# Patient Record
Sex: Female | Born: 1955 | Race: White | Hispanic: No | Marital: Married
Health system: Southern US, Community
[De-identification: ages and names within clinical notes are randomized; demographics above are authoritative.]

---

## 2009-10-23 DIAGNOSIS — I10 Essential (primary) hypertension: Secondary | ICD-10-CM | POA: Insufficient documentation

## 2012-08-19 DIAGNOSIS — Z720 Tobacco use: Secondary | ICD-10-CM | POA: Insufficient documentation

## 2012-09-21 DIAGNOSIS — H40059 Ocular hypertension, unspecified eye: Secondary | ICD-10-CM | POA: Insufficient documentation

## 2012-09-21 DIAGNOSIS — K589 Irritable bowel syndrome without diarrhea: Secondary | ICD-10-CM | POA: Insufficient documentation

## 2012-09-21 DIAGNOSIS — M1710 Unilateral primary osteoarthritis, unspecified knee: Secondary | ICD-10-CM | POA: Insufficient documentation

## 2017-05-13 DIAGNOSIS — F1010 Alcohol abuse, uncomplicated: Secondary | ICD-10-CM | POA: Insufficient documentation

## 2018-02-23 DIAGNOSIS — Z8711 Personal history of peptic ulcer disease: Secondary | ICD-10-CM | POA: Insufficient documentation

## 2018-02-23 DIAGNOSIS — Z8719 Personal history of other diseases of the digestive system: Secondary | ICD-10-CM | POA: Insufficient documentation

## 2018-02-23 DIAGNOSIS — I4891 Unspecified atrial fibrillation: Secondary | ICD-10-CM | POA: Insufficient documentation

## 2018-02-23 DIAGNOSIS — G43109 Migraine with aura, not intractable, without status migrainosus: Secondary | ICD-10-CM | POA: Insufficient documentation

## 2018-02-23 DIAGNOSIS — D500 Iron deficiency anemia secondary to blood loss (chronic): Secondary | ICD-10-CM | POA: Insufficient documentation

## 2018-02-23 DIAGNOSIS — Z791 Long term (current) use of non-steroidal anti-inflammatories (NSAID): Secondary | ICD-10-CM | POA: Insufficient documentation

## 2018-02-25 DIAGNOSIS — S82122A Displaced fracture of lateral condyle of left tibia, initial encounter for closed fracture: Secondary | ICD-10-CM | POA: Insufficient documentation

## 2018-03-28 ENCOUNTER — Emergency Department (HOSPITAL_COMMUNITY): Payer: Self-pay

## 2018-03-28 ENCOUNTER — Inpatient Hospital Stay (HOSPITAL_COMMUNITY)
Admission: EM | Admit: 2018-03-28 | Discharge: 2018-04-21 | DRG: 308 | Disposition: E | Payer: Self-pay | Attending: Pulmonary Disease | Admitting: Pulmonary Disease

## 2018-03-28 ENCOUNTER — Emergency Department (HOSPITAL_COMMUNITY)
Admission: EM | Admit: 2018-03-28 | Discharge: 2018-03-28 | Discharge status: Absent without leave | Payer: Self-pay | Attending: Emergency Medicine | Admitting: Emergency Medicine

## 2018-03-28 DIAGNOSIS — E669 Obesity, unspecified: Secondary | ICD-10-CM | POA: Diagnosis present

## 2018-03-28 DIAGNOSIS — D696 Thrombocytopenia, unspecified: Secondary | ICD-10-CM | POA: Diagnosis not present

## 2018-03-28 DIAGNOSIS — Z888 Allergy status to other drugs, medicaments and biological substances status: Secondary | ICD-10-CM

## 2018-03-28 DIAGNOSIS — N179 Acute kidney failure, unspecified: Secondary | ICD-10-CM

## 2018-03-28 DIAGNOSIS — D509 Iron deficiency anemia, unspecified: Secondary | ICD-10-CM | POA: Diagnosis present

## 2018-03-28 DIAGNOSIS — R739 Hyperglycemia, unspecified: Secondary | ICD-10-CM | POA: Diagnosis not present

## 2018-03-28 DIAGNOSIS — G931 Anoxic brain damage, not elsewhere classified: Secondary | ICD-10-CM | POA: Diagnosis present

## 2018-03-28 DIAGNOSIS — R578 Other shock: Secondary | ICD-10-CM | POA: Diagnosis present

## 2018-03-28 DIAGNOSIS — R402213 Coma scale, best verbal response, none, at hospital admission: Secondary | ICD-10-CM | POA: Diagnosis present

## 2018-03-28 DIAGNOSIS — R402113 Coma scale, eyes open, never, at hospital admission: Secondary | ICD-10-CM | POA: Diagnosis present

## 2018-03-28 DIAGNOSIS — J9811 Atelectasis: Secondary | ICD-10-CM | POA: Diagnosis present

## 2018-03-28 DIAGNOSIS — Z7982 Long term (current) use of aspirin: Secondary | ICD-10-CM

## 2018-03-28 DIAGNOSIS — I469 Cardiac arrest, cause unspecified: Secondary | ICD-10-CM

## 2018-03-28 DIAGNOSIS — E872 Acidosis, unspecified: Secondary | ICD-10-CM

## 2018-03-28 DIAGNOSIS — I10 Essential (primary) hypertension: Secondary | ICD-10-CM | POA: Diagnosis present

## 2018-03-28 DIAGNOSIS — I4901 Ventricular fibrillation: Principal | ICD-10-CM | POA: Diagnosis present

## 2018-03-28 DIAGNOSIS — E162 Hypoglycemia, unspecified: Secondary | ICD-10-CM | POA: Diagnosis present

## 2018-03-28 DIAGNOSIS — Z6834 Body mass index (BMI) 34.0-34.9, adult: Secondary | ICD-10-CM

## 2018-03-28 DIAGNOSIS — E871 Hypo-osmolality and hyponatremia: Secondary | ICD-10-CM | POA: Diagnosis not present

## 2018-03-28 DIAGNOSIS — K219 Gastro-esophageal reflux disease without esophagitis: Secondary | ICD-10-CM | POA: Diagnosis present

## 2018-03-28 DIAGNOSIS — G935 Compression of brain: Secondary | ICD-10-CM | POA: Diagnosis not present

## 2018-03-28 DIAGNOSIS — J9601 Acute respiratory failure with hypoxia: Secondary | ICD-10-CM | POA: Diagnosis present

## 2018-03-28 DIAGNOSIS — Z515 Encounter for palliative care: Secondary | ICD-10-CM | POA: Diagnosis not present

## 2018-03-28 DIAGNOSIS — G9520 Unspecified cord compression: Secondary | ICD-10-CM | POA: Diagnosis not present

## 2018-03-28 DIAGNOSIS — Z66 Do not resuscitate: Secondary | ICD-10-CM | POA: Diagnosis not present

## 2018-03-28 DIAGNOSIS — F1010 Alcohol abuse, uncomplicated: Secondary | ICD-10-CM | POA: Diagnosis present

## 2018-03-28 DIAGNOSIS — M797 Fibromyalgia: Secondary | ICD-10-CM | POA: Diagnosis present

## 2018-03-28 DIAGNOSIS — I462 Cardiac arrest due to underlying cardiac condition: Secondary | ICD-10-CM | POA: Diagnosis present

## 2018-03-28 DIAGNOSIS — G43909 Migraine, unspecified, not intractable, without status migrainosus: Secondary | ICD-10-CM | POA: Diagnosis present

## 2018-03-28 DIAGNOSIS — Z881 Allergy status to other antibiotic agents status: Secondary | ICD-10-CM

## 2018-03-28 DIAGNOSIS — F329 Major depressive disorder, single episode, unspecified: Secondary | ICD-10-CM | POA: Diagnosis present

## 2018-03-28 DIAGNOSIS — R402313 Coma scale, best motor response, none, at hospital admission: Secondary | ICD-10-CM | POA: Diagnosis present

## 2018-03-28 DIAGNOSIS — G936 Cerebral edema: Secondary | ICD-10-CM | POA: Diagnosis not present

## 2018-03-28 DIAGNOSIS — J690 Pneumonitis due to inhalation of food and vomit: Secondary | ICD-10-CM | POA: Diagnosis present

## 2018-03-28 DIAGNOSIS — K7200 Acute and subacute hepatic failure without coma: Secondary | ICD-10-CM

## 2018-03-28 DIAGNOSIS — J9690 Respiratory failure, unspecified, unspecified whether with hypoxia or hypercapnia: Secondary | ICD-10-CM

## 2018-03-28 DIAGNOSIS — G9341 Metabolic encephalopathy: Secondary | ICD-10-CM | POA: Diagnosis present

## 2018-03-28 DIAGNOSIS — Z9911 Dependence on respirator [ventilator] status: Secondary | ICD-10-CM

## 2018-03-28 DIAGNOSIS — Z9289 Personal history of other medical treatment: Secondary | ICD-10-CM

## 2018-03-28 DIAGNOSIS — E876 Hypokalemia: Secondary | ICD-10-CM | POA: Diagnosis present

## 2018-03-28 DIAGNOSIS — E875 Hyperkalemia: Secondary | ICD-10-CM | POA: Diagnosis not present

## 2018-03-28 LAB — CBC
HCT: 27.8 % — ABNORMAL LOW (ref 36.0–46.0)
HEMATOCRIT: 29.6 % — AB (ref 36.0–46.0)
Hemoglobin: 7.4 g/dL — ABNORMAL LOW (ref 12.0–15.0)
Hemoglobin: 8.4 g/dL — ABNORMAL LOW (ref 12.0–15.0)
MCH: 18.6 pg — ABNORMAL LOW (ref 26.0–34.0)
MCH: 19 pg — AB (ref 26.0–34.0)
MCHC: 26.6 g/dL — ABNORMAL LOW (ref 30.0–36.0)
MCHC: 28.4 g/dL — AB (ref 30.0–36.0)
MCV: 70 fL — AB (ref 78.0–100.0)
MCV: 67 fL — AB (ref 78.0–100.0)
PLATELETS: 405 10*3/uL — AB (ref 150–400)
PLATELETS: 365 10*3/uL (ref 150–400)
RBC: 3.97 MIL/uL (ref 3.87–5.11)
RBC: 4.42 MIL/uL (ref 3.87–5.11)
RDW: 28.7 % — ABNORMAL HIGH (ref 11.5–15.5)
RDW: 28.4 % — AB (ref 11.5–15.5)
WBC: 13.6 10*3/uL — AB (ref 4.0–10.5)
WBC: 15.4 10*3/uL — ABNORMAL HIGH (ref 4.0–10.5)

## 2018-03-28 LAB — BLOOD GAS, ARTERIAL
ACID-BASE DEFICIT: 20.5 mmol/L — AB (ref 0.0–2.0)
ACID-BASE DEFICIT: 17.2 mmol/L — AB (ref 0.0–2.0)
Acid-base deficit: 11.3 mmol/L — ABNORMAL HIGH (ref 0.0–2.0)
BICARBONATE: 8.6 mmol/L — AB (ref 20.0–28.0)
BICARBONATE: 11 mmol/L — AB (ref 20.0–28.0)
Bicarbonate: 17.2 mmol/L — ABNORMAL LOW (ref 20.0–28.0)
Drawn by: 277331
FIO2: 1
FIO2: 0.6
LHR: 14 {breaths}/min
MECHVT: 560 mL
MECHVT: 490 mL
O2 Saturation: 97.5 %
O2 Saturation: 98.2 %
O2 Saturation: 93.1 %
PATIENT TEMPERATURE: 37
PATIENT TEMPERATURE: 91
PCO2 ART: 51.1 mmHg — AB (ref 32.0–48.0)
PEEP/CPAP: 5 cmH2O
PEEP/CPAP: 5 cmH2O
PEEP: 5 cmH2O
PH ART: 7.086 — AB (ref 7.350–7.450)
PO2 ART: 64.4 mmHg — AB (ref 83.0–108.0)
Patient temperature: 37
RATE: 22 resp/min
RATE: 24 resp/min
VT: 490 mL
pCO2 arterial: 49.4 mmHg — ABNORMAL HIGH (ref 32.0–48.0)
pCO2 arterial: 37.5 mmHg (ref 32.0–48.0)
pH, Arterial: 6.916 — CL (ref 7.350–7.450)
pH, Arterial: 7.121 — CL (ref 7.350–7.450)
pO2, Arterial: 185 mmHg — ABNORMAL HIGH (ref 83.0–108.0)
pO2, Arterial: 160 mmHg — ABNORMAL HIGH (ref 83.0–108.0)

## 2018-03-28 LAB — POCT I-STAT 3, ART BLOOD GAS (G3+)
ACID-BASE DEFICIT: 15 mmol/L — AB (ref 0.0–2.0)
BICARBONATE: 13.3 mmol/L — AB (ref 20.0–28.0)
O2 SAT: 95 %
PO2 ART: 75 mmHg — AB (ref 83.0–108.0)
TCO2: 15 mmol/L — AB (ref 22–32)
pCO2 arterial: 33.2 mmHg (ref 32.0–48.0)
pH, Arterial: 7.188 — CL (ref 7.350–7.450)

## 2018-03-28 LAB — BASIC METABOLIC PANEL
ANION GAP: 20 — AB (ref 5–15)
Anion gap: 14 (ref 5–15)
BUN: 31 mg/dL — ABNORMAL HIGH (ref 6–20)
BUN: 33 mg/dL — AB (ref 6–20)
CALCIUM: 7.9 mg/dL — AB (ref 8.9–10.3)
CALCIUM: 7.5 mg/dL — AB (ref 8.9–10.3)
CHLORIDE: 108 mmol/L (ref 101–111)
CO2: 13 mmol/L — AB (ref 22–32)
CO2: 16 mmol/L — ABNORMAL LOW (ref 22–32)
CREATININE: 2.74 mg/dL — AB (ref 0.44–1.00)
CREATININE: 2.83 mg/dL — AB (ref 0.44–1.00)
Chloride: 108 mmol/L (ref 101–111)
GFR calc Af Amer: 19 mL/min — ABNORMAL LOW (ref >60)
GFR, EST AFRICAN AMERICAN: 20 mL/min — AB (ref >60)
GFR, EST NON AFRICAN AMERICAN: 17 mL/min — AB (ref >60)
GFR, EST NON AFRICAN AMERICAN: 17 mL/min — AB (ref >60)
GLUCOSE: 282 mg/dL — AB (ref 65–99)
Glucose, Bld: 390 mg/dL — ABNORMAL HIGH (ref 65–99)
Potassium: 2.3 mmol/L — CL (ref 3.5–5.1)
Potassium: 2.1 mmol/L — CL (ref 3.5–5.1)
SODIUM: 138 mmol/L (ref 135–145)
Sodium: 141 mmol/L (ref 135–145)

## 2018-03-28 LAB — POCT I-STAT, CHEM 8
BUN: 29 mg/dL — ABNORMAL HIGH (ref 6–20)
BUN: 30 mg/dL — ABNORMAL HIGH (ref 6–20)
CALCIUM ION: 1.1 mmol/L — AB (ref 1.15–1.40)
CALCIUM ION: 1.16 mmol/L (ref 1.15–1.40)
Chloride: 108 mmol/L (ref 101–111)
Chloride: 106 mmol/L (ref 101–111)
Creatinine, Ser: 2.5 mg/dL — ABNORMAL HIGH (ref 0.44–1.00)
Creatinine, Ser: 2.6 mg/dL — ABNORMAL HIGH (ref 0.44–1.00)
GLUCOSE: 282 mg/dL — AB (ref 65–99)
GLUCOSE: 344 mg/dL — AB (ref 65–99)
HCT: 30 % — ABNORMAL LOW (ref 36.0–46.0)
HCT: 29 % — ABNORMAL LOW (ref 36.0–46.0)
HEMOGLOBIN: 10.2 g/dL — AB (ref 12.0–15.0)
Hemoglobin: 9.9 g/dL — ABNORMAL LOW (ref 12.0–15.0)
Potassium: 2.4 mmol/L — CL (ref 3.5–5.1)
Potassium: 2.4 mmol/L — CL (ref 3.5–5.1)
SODIUM: 145 mmol/L (ref 135–145)
SODIUM: 144 mmol/L (ref 135–145)
TCO2: 17 mmol/L — ABNORMAL LOW (ref 22–32)
TCO2: 19 mmol/L — ABNORMAL LOW (ref 22–32)

## 2018-03-28 LAB — I-STAT CG4 LACTIC ACID, ED
LACTIC ACID, VENOUS: 11.06 mmol/L — AB (ref 0.5–1.9)
Lactic Acid, Venous: 9 mmol/L (ref 0.5–1.9)

## 2018-03-28 LAB — PROTIME-INR
INR: 1.86
PROTHROMBIN TIME: 21.2 s — AB (ref 11.4–15.2)

## 2018-03-28 LAB — RAPID URINE DRUG SCREEN, HOSP PERFORMED
Amphetamines: NOT DETECTED
BARBITURATES: NOT DETECTED
Benzodiazepines: POSITIVE — AB
Cocaine: NOT DETECTED
OPIATES: NOT DETECTED
TETRAHYDROCANNABINOL: NOT DETECTED

## 2018-03-28 LAB — TROPONIN I
Troponin I: 0.06 ng/mL (ref <0.03)
Troponin I: 0.4 ng/mL (ref <0.03)
Troponin I: 0.73 ng/mL (ref <0.03)

## 2018-03-28 LAB — COMPREHENSIVE METABOLIC PANEL
ALBUMIN: 2.7 g/dL — AB (ref 3.5–5.0)
ALT: 2720 U/L — AB (ref 14–54)
AST: 2119 U/L — AB (ref 15–41)
Alkaline Phosphatase: 92 U/L (ref 38–126)
Anion gap: 20 — ABNORMAL HIGH (ref 5–15)
BUN: 28 mg/dL — AB (ref 6–20)
CHLORIDE: 107 mmol/L (ref 101–111)
CO2: 14 mmol/L — AB (ref 22–32)
CREATININE: 2.61 mg/dL — AB (ref 0.44–1.00)
Calcium: 9.9 mg/dL (ref 8.9–10.3)
GFR calc Af Amer: 21 mL/min — ABNORMAL LOW (ref >60)
GFR, EST NON AFRICAN AMERICAN: 18 mL/min — AB (ref >60)
GLUCOSE: 275 mg/dL — AB (ref 65–99)
Potassium: 3.9 mmol/L (ref 3.5–5.1)
Sodium: 141 mmol/L (ref 135–145)
Total Bilirubin: 0.7 mg/dL (ref 0.3–1.2)
Total Protein: 6 g/dL — ABNORMAL LOW (ref 6.5–8.1)

## 2018-03-28 LAB — I-STAT TROPONIN, ED: TROPONIN I, POC: 0.08 ng/mL (ref 0.00–0.08)

## 2018-03-28 LAB — POCT I-STAT 4, (NA,K, GLUC, HGB,HCT)
GLUCOSE: 362 mg/dL — AB (ref 65–99)
HEMATOCRIT: 28 % — AB (ref 36.0–46.0)
Hemoglobin: 9.5 g/dL — ABNORMAL LOW (ref 12.0–15.0)
POTASSIUM: 2.3 mmol/L — AB (ref 3.5–5.1)
Sodium: 143 mmol/L (ref 135–145)

## 2018-03-28 LAB — GLUCOSE, CAPILLARY
GLUCOSE-CAPILLARY: 315 mg/dL — AB (ref 65–99)
GLUCOSE-CAPILLARY: 342 mg/dL — AB (ref 65–99)
Glucose-Capillary: 286 mg/dL — ABNORMAL HIGH (ref 65–99)

## 2018-03-28 LAB — APTT: aPTT: 41 seconds — ABNORMAL HIGH (ref 24–36)

## 2018-03-28 LAB — HEMOGLOBIN A1C
HEMOGLOBIN A1C: 5.6 % (ref 4.8–5.6)
Mean Plasma Glucose: 114.02 mg/dL

## 2018-03-28 LAB — MRSA PCR SCREENING: MRSA by PCR: NEGATIVE

## 2018-03-28 LAB — LACTIC ACID, PLASMA: LACTIC ACID, VENOUS: 9.3 mmol/L — AB (ref 0.5–1.9)

## 2018-03-28 LAB — ETHANOL: Alcohol, Ethyl (B): 16 mg/dL — ABNORMAL HIGH (ref <10)

## 2018-03-28 LAB — ACETAMINOPHEN LEVEL: Acetaminophen (Tylenol), Serum: <10 ug/mL — ABNORMAL LOW (ref 10–30)

## 2018-03-28 MED ORDER — SODIUM CHLORIDE 0.9 % IV SOLN: INTRAVENOUS | Status: DC

## 2018-03-28 MED ORDER — HEPARIN (PORCINE) IN NACL 100-0.45 UNIT/ML-% IJ SOLN
1150.0000 [IU]/h | INTRAMUSCULAR | Status: DC
  Administered 2018-03-28 – 2018-03-30 (×3): 1100 [IU]/h via INTRAVENOUS
  Administered 2018-03-31: 1150 [IU]/h via INTRAVENOUS
  Filled 2018-03-28 (×4): qty 250

## 2018-03-28 MED ORDER — EPINEPHRINE PF 1 MG/ML IJ SOLN
0.5000 ug/min | INTRAVENOUS | Status: DC
  Filled 2018-03-28: qty 4

## 2018-03-28 MED ORDER — ARTIFICIAL TEARS OPHTHALMIC OINT
1.0000 "application " | TOPICAL_OINTMENT | Freq: Three times a day (TID) | OPHTHALMIC | Status: DC
  Administered 2018-03-29 – 2018-03-30 (×4): 1 via OPHTHALMIC
  Filled 2018-03-28: qty 3.5

## 2018-03-28 MED ORDER — SODIUM BICARBONATE 8.4 % IV SOLN
INTRAVENOUS | Status: AC
  Filled 2018-03-28: qty 50

## 2018-03-28 MED ORDER — SODIUM CHLORIDE 0.9 % IV SOLN
INTRAVENOUS | Status: DC
  Administered 2018-03-28: 2.8 [IU]/h via INTRAVENOUS
  Administered 2018-03-29: 26.4 [IU]/h via INTRAVENOUS
  Administered 2018-03-29: 34.1 [IU]/h via INTRAVENOUS
  Administered 2018-03-29: 32 [IU]/h via INTRAVENOUS
  Administered 2018-03-29: 36.9 [IU]/h via INTRAVENOUS
  Administered 2018-03-29: 20.4 [IU]/h via INTRAVENOUS
  Administered 2018-03-30: 25.7 [IU]/h via INTRAVENOUS
  Filled 2018-03-28 (×8): qty 1

## 2018-03-28 MED ORDER — NOREPINEPHRINE 4 MG/250ML-% IV SOLN
INTRAVENOUS | Status: AC
  Filled 2018-03-28: qty 250

## 2018-03-28 MED ORDER — SODIUM CHLORIDE 0.9 % IV SOLN
2.0000 mg/h | INTRAVENOUS | Status: DC
  Administered 2018-03-28 – 2018-03-29 (×2): 2 mg/h via INTRAVENOUS
  Filled 2018-03-28 (×3): qty 10

## 2018-03-28 MED ORDER — AZTREONAM 1 G IJ SOLR
1.0000 g | Freq: Three times a day (TID) | INTRAMUSCULAR | Status: DC
  Administered 2018-03-28 – 2018-04-03 (×17): 1 g via INTRAVENOUS
  Filled 2018-03-28 (×20): qty 1

## 2018-03-28 MED ORDER — FENTANYL 2500MCG IN NS 250ML (10MCG/ML) PREMIX INFUSION
50.0000 ug/h | INTRAVENOUS | Status: DC
  Administered 2018-03-28: 50 ug/h via INTRAVENOUS
  Filled 2018-03-28 (×3): qty 250

## 2018-03-28 MED ORDER — CHLORHEXIDINE GLUCONATE CLOTH 2 % EX PADS
6.0000 | MEDICATED_PAD | Freq: Every day | CUTANEOUS | Status: DC
  Administered 2018-03-29 – 2018-04-03 (×6): 6 via TOPICAL

## 2018-03-28 MED ORDER — ASPIRIN 300 MG RE SUPP
300.0000 mg | RECTAL | Status: AC
  Administered 2018-03-28: 300 mg via RECTAL
  Filled 2018-03-28: qty 1

## 2018-03-28 MED ORDER — SODIUM BICARBONATE 8.4 % IV SOLN
50.0000 meq | Freq: Once | INTRAVENOUS | Status: AC
  Administered 2018-03-28: 50 meq via INTRAVENOUS

## 2018-03-28 MED ORDER — LEVETIRACETAM IN NACL 1000 MG/100ML IV SOLN
1000.0000 mg | Freq: Two times a day (BID) | INTRAVENOUS | Status: DC
  Administered 2018-03-28 – 2018-04-01 (×8): 1000 mg via INTRAVENOUS
  Filled 2018-03-28 (×8): qty 100

## 2018-03-28 MED ORDER — LORAZEPAM 2 MG/ML IJ SOLN
2.0000 mg | Freq: Once | INTRAMUSCULAR | Status: DC
  Filled 2018-03-28: qty 1

## 2018-03-28 MED ORDER — HEPARIN BOLUS VIA INFUSION
4000.0000 [IU] | Freq: Once | INTRAVENOUS | Status: AC
  Administered 2018-03-28: 4000 [IU] via INTRAVENOUS
  Filled 2018-03-28: qty 4000

## 2018-03-28 MED ORDER — SODIUM BICARBONATE 8.4 % IV SOLN
100.0000 meq | Freq: Once | INTRAVENOUS | Status: AC
  Administered 2018-03-29: 100 meq via INTRAVENOUS
  Filled 2018-03-28: qty 100

## 2018-03-28 MED ORDER — LORAZEPAM 2 MG/ML IJ SOLN
INTRAMUSCULAR | Status: AC
  Filled 2018-03-28: qty 1

## 2018-03-28 MED ORDER — INSULIN ASPART 100 UNIT/ML ~~LOC~~ SOLN
0.0000 [IU] | SUBCUTANEOUS | Status: DC
Start: 2018-03-28 — End: 2018-03-28
  Administered 2018-03-28: 8 [IU] via SUBCUTANEOUS
  Administered 2018-03-28: 11 [IU] via SUBCUTANEOUS

## 2018-03-28 MED ORDER — CALCIUM CHLORIDE 10 % IV SOLN
1.0000 g | Freq: Once | INTRAVENOUS | Status: AC
  Administered 2018-03-28: 1 g via INTRAVENOUS

## 2018-03-28 MED ORDER — NOREPINEPHRINE BITARTRATE 1 MG/ML IV SOLN
0.0000 ug/min | INTRAVENOUS | Status: DC
  Filled 2018-03-28 (×2): qty 4

## 2018-03-28 MED ORDER — SODIUM CHLORIDE 0.9% FLUSH: 10.0000 mL | INTRAVENOUS | Status: DC | PRN

## 2018-03-28 MED ORDER — CLINDAMYCIN PHOSPHATE 600 MG/50ML IV SOLN
600.0000 mg | Freq: Three times a day (TID) | INTRAVENOUS | Status: DC
  Administered 2018-03-28 – 2018-04-02 (×15): 600 mg via INTRAVENOUS
  Filled 2018-03-28 (×15): qty 50

## 2018-03-28 MED ORDER — EPINEPHRINE PF 1 MG/ML IJ SOLN
1.0000 mg | Freq: Once | INTRAMUSCULAR | Status: AC
  Administered 2018-03-28: 1 mg via INTRAVENOUS

## 2018-03-28 MED ORDER — LORAZEPAM 2 MG/ML IJ SOLN
2.0000 mg | INTRAMUSCULAR | Status: DC | PRN
  Administered 2018-04-03: 2 mg via INTRAVENOUS
  Filled 2018-03-28: qty 1

## 2018-03-28 MED ORDER — POTASSIUM CHLORIDE 10 MEQ/50ML IV SOLN
10.0000 meq | INTRAVENOUS | Status: AC
  Administered 2018-03-28 (×4): 10 meq via INTRAVENOUS
  Filled 2018-03-28 (×3): qty 50

## 2018-03-28 MED ORDER — HEPARIN SODIUM (PORCINE) 5000 UNIT/ML IJ SOLN: 5000.0000 [IU] | Freq: Three times a day (TID) | INTRAMUSCULAR | Status: DC

## 2018-03-28 MED ORDER — FENTANYL CITRATE (PF) 100 MCG/2ML IJ SOLN
100.0000 ug | Freq: Once | INTRAMUSCULAR | Status: DC
  Filled 2018-03-28: qty 2

## 2018-03-28 MED ORDER — EPINEPHRINE PF 1 MG/ML IJ SOLN
0.5000 ug/min | INTRAVENOUS | Status: DC
  Administered 2018-03-28 – 2018-03-29 (×2): 15 ug/min via INTRAVENOUS
  Administered 2018-03-29: 12 ug/min via INTRAVENOUS
  Administered 2018-03-29: 13 ug/min via INTRAVENOUS
  Administered 2018-03-30: 12 ug/min via INTRAVENOUS
  Administered 2018-03-30 (×2): 13 ug/min via INTRAVENOUS
  Administered 2018-03-31: 14 ug/min via INTRAVENOUS
  Administered 2018-03-31: 13 ug/min via INTRAVENOUS
  Administered 2018-03-31: 14 ug/min via INTRAVENOUS
  Administered 2018-03-31: 13 ug/min via INTRAVENOUS
  Administered 2018-04-01: 12 ug/min via INTRAVENOUS
  Administered 2018-04-01: 5 ug/min via INTRAVENOUS
  Filled 2018-03-28 (×16): qty 4

## 2018-03-28 MED ORDER — NOREPINEPHRINE BITARTRATE 1 MG/ML IV SOLN
0.0000 ug/min | INTRAVENOUS | Status: DC
  Administered 2018-03-29: 38 ug/min via INTRAVENOUS
  Administered 2018-03-29: 48 ug/min via INTRAVENOUS
  Administered 2018-03-29: 42 ug/min via INTRAVENOUS
  Administered 2018-03-29: 46 ug/min via INTRAVENOUS
  Administered 2018-03-30: 47 ug/min via INTRAVENOUS
  Administered 2018-03-30: 62 ug/min via INTRAVENOUS
  Administered 2018-03-30 – 2018-03-31 (×2): 60 ug/min via INTRAVENOUS
  Administered 2018-03-31: 33 ug/min via INTRAVENOUS
  Administered 2018-03-31: 41 ug/min via INTRAVENOUS
  Administered 2018-03-31: 45 ug/min via INTRAVENOUS
  Administered 2018-04-01: 20 ug/min via INTRAVENOUS
  Filled 2018-03-28 (×16): qty 16

## 2018-03-28 MED ORDER — SODIUM CHLORIDE 0.9 % IV SOLN
1.0000 g | Freq: Once | INTRAVENOUS | Status: AC
  Administered 2018-03-28: 1 g via INTRAVENOUS

## 2018-03-28 MED ORDER — SODIUM BICARBONATE 8.4 % IV SOLN
INTRAVENOUS | Status: DC
  Administered 2018-03-29 – 2018-03-30 (×6): via INTRAVENOUS
  Filled 2018-03-28 (×15): qty 150

## 2018-03-28 MED ORDER — SODIUM CHLORIDE 0.9% FLUSH
10.0000 mL | Freq: Two times a day (BID) | INTRAVENOUS | Status: DC
  Administered 2018-03-29 – 2018-04-02 (×8): 10 mL

## 2018-03-28 MED ORDER — POTASSIUM CHLORIDE 10 MEQ/50ML IV SOLN
INTRAVENOUS | Status: AC
  Administered 2018-03-28: 10 meq
  Filled 2018-03-28: qty 50

## 2018-03-28 MED ORDER — SODIUM CHLORIDE 0.9 % IV SOLN
0.5000 ug/kg/min | INTRAVENOUS | Status: DC
  Administered 2018-03-28: 0.5 ug/kg/min via INTRAVENOUS
  Filled 2018-03-28: qty 20

## 2018-03-28 NOTE — Progress Notes (Signed)
Dr. Franchot Erichsenosenblatt notified of lactic acid level 9.3

## 2018-03-28 NOTE — Progress Notes (Addendum)
eLink Physician-Brief Progress Note Patient Name: Courtney GuthrieDeborah Norton DOB: 12/18/1956 MRN: [id number]   Date of Service  04/14/2018  HPI/Events of Note  Multiple issues: 1. Hypotension - Norepinephrine IV infusion and Epinephrine IV infusion both at ceiling to maintain MAP of 80 and 2. Troponin = 0.08 in setting of cardiac arrest and prolonged CPR. Already on ASA.  eICU Interventions  Will order: 1. ABG now.  2. Increase ceiling on Norepinephrine IV infusion to 70 mcg/min. 3. Continue to trend Troponin.      Intervention Category Major Interventions: Hypotension - evaluation and management  Sommer,Steven Eugene 04/06/2018, 10:55 PM

## 2018-03-28 NOTE — ED Provider Notes (Addendum)
[facility] EMERGENCY DEPARTMENT Provider Note   CSN: [id number] Arrival date & time: Mar 29, 2018  1314     History   Chief Complaint Chief Complaint  Patient presents with  . Cardiac Arrest    HPI Courtney Norton is a 62 y.o. female.  HPI   The patient is a 62 year old female who presents with a chief complaint of cardiac arrest  The patient is unable to give any information, she is unresponsive and in a rhythm of asystole when she was found by paramedics in her hotel room.  Per the friend who was staying in the hotel room with her he had left to go talk to the made, when he came back inside the patient was frothing at the mouth and unresponsive on the bed.  He positioned her so that she would not choke on her secretions, the 911 service was activated and very quickly a police officer arrived, first responders found the patient to be in asystole, placed a King airway and started CPR immediately.  The patient had a brief return of spontaneous circulation with a wide-complex rhythm and weak pulses which was lost in route.  On arrival the patient is again in asystole completely unresponsive and not having any spontaneous breathing.  There is a reported history of a possible arrhythmia for which she was seen at an outside hospital several months ago but no other information is available.  She did have some medications at home including Cardizem.  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  The friend states that she has not been depressed or suicidal.   No past medical history on file.  Patient Active Problem List   Diagnosis Date Noted  . Cardiac arrest (HCC) 2018/03/29     The histories are not reviewed yet. Please review them in the "History" navigator section and refresh this SmartLink.   OB History   None      Home Medications    Prior to Admission medications   Medication Sig Start Date End Date Taking? Authorizing Provider  aspirin EC 81 MG tablet Take 81 mg  by mouth daily.   Yes [provider]  citalopram (CELEXA) 40 MG tablet Take 40 mg by mouth daily.   Yes [provider]  diazepam (VALIUM) 2 MG tablet Take 2 mg by mouth every 8 (eight) hours as needed for anxiety.   Yes [provider]  diltiazem (CARDIZEM CD) 120 MG 24 hr capsule Take by mouth. 02/26/18  Yes [provider]  gabapentin (NEURONTIN) 300 MG capsule Take 300 mg by mouth 3 (three) times daily.   Yes [provider]  omeprazole (PRILOSEC) 40 MG capsule Take 40 mg by mouth daily.   Yes [provider]  topiramate (TOPAMAX) 50 MG tablet Take 50 mg by mouth 2 (two) times daily.   Yes [provider]    Family History No family history on file.  Social History Social History   Tobacco Use  . Smoking status: Not on file  Substance Use Topics  . Alcohol use: Not on file  . Drug use: Not on file     Allergies   Cefaclor; Nitrofurantoin; Ciclonium; Macrodantin  [nitrofurantoin macrocrystal]; and Doxycycline   Review of Systems Review of Systems  Unable to perform ROS: Patient unresponsive     Physical Exam Updated Vital Signs BP (!) 97/37   Pulse 87   Temp (!) 93.2 F (34 C)   Resp (!) 23   Ht  5\' 7"  (1.702 m)   Wt 99.8 kg (220 lb)   SpO2 94%   BMI 34.46 kg/m   Physical Exam  Physical Exam  Constitutional: She appears well-developed. She appears distressed.  HENT:  Head: Normocephalic and atraumatic.  Mouth/Throat: Oropharynx is clear and moist. No oropharyngeal exudate.  The sputum in the mouth, there is no signs of injury to the tongue or the teeth  Eyes: Conjunctivae are normal. Right eye exhibits no discharge. Left eye exhibits no discharge. No scleral icterus.  Pupils are 5 mm, symmetrical, and reactive to light  Neck: Normal range of motion. Neck supple. No JVD present. No thyromegaly present.  Cardiovascular:  There are no pulses on arrival, CPR was continued  Pulmonary/Chest: She has  no wheezes. She has no rales.  Mechanical ventilation with breath sounds bilaterally, no spontaneous respirations  Abdominal: Soft. Bowel sounds are normal. She exhibits no distension and no mass. There is no tenderness.  Musculoskeletal: Normal range of motion. She exhibits no edema or tenderness.  All 4 extremities inspected with no signs of trauma or deformity, intraosseous line placed in the right tibia, removed because of lack of attachment to bone  Lymphadenopathy:    She has no cervical adenopathy.  Neurological:  Obtunded, unresponsive  Skin: Skin is warm and dry. No rash noted. No erythema.  Nursing note and vitals reviewed.   ED Treatments / Results  Labs (all labs ordered are listed, but only abnormal results are displayed) Labs Reviewed  TROPONIN I - Abnormal; Notable for the following components:      Result Value   Troponin I 0.06 (*)    All other components within normal limits  CBC - Abnormal; Notable for the following components:   WBC 13.6 (*)    Hemoglobin 7.4 (*)    HCT 27.8 (*)    MCV 70.0 (*)    MCH 18.6 (*)    MCHC 26.6 (*)    RDW 28.7 (*)    Platelets 405 (*)    All other components within normal limits  COMPREHENSIVE METABOLIC PANEL - Abnormal; Notable for the following components:   CO2 14 (*)    Glucose, Bld 275 (*)    BUN 28 (*)    Creatinine, Ser 2.61 (*)    Total Protein 6.0 (*)    Albumin 2.7 (*)    AST 2,119 (*)    ALT 2,720 (*)    GFR calc non Af Amer 18 (*)    GFR calc Af Amer 21 (*)    Anion gap 20 (*)    All other components within normal limits  RAPID URINE DRUG SCREEN, HOSP PERFORMED - Abnormal; Notable for the following components:   Benzodiazepines POSITIVE (*)    All other components within normal limits  BLOOD GAS, ARTERIAL - Abnormal; Notable for the following components:   pH, Arterial 6.916 (*)    pCO2 arterial 49.4 (*)    pO2, Arterial 185 (*)    Bicarbonate 8.6 (*)    Acid-base deficit 20.5 (*)    All other components  within normal limits  I-STAT CG4 LACTIC ACID, ED - Abnormal; Notable for the following components:   Lactic Acid, Venous 11.06 (*)    All other components within normal limits  ETHANOL  ACETAMINOPHEN LEVEL     EKG EKG Interpretation  Date/Time:  Sunday 04-26-2018 13:26:49 EDT Ventricular Rate:  92 PR Interval:    QRS Duration: 179 QT Interval:  414 QTC Calculation: 513  R Axis:   26 Text Interpretation:  Sinus rhythm Right bundle branch block slt RBBB now preasent abnormal rhythm Confirmed by Eber Hong ([id number]) on Mar 31, 2018 1:34:47 PM    Radiology No results found.  Procedures .Critical Care Performed by: Eber Hong, MD Authorized by: Eber Hong, MD   Critical care provider statement:    Critical care time (minutes):  35   Critical care time was exclusive of:  Separately billable procedures and treating other patients and teaching time   Critical care was necessary to treat or prevent imminent or life-threatening deterioration of the following conditions:  Cardiac failure   Critical care was time spent personally by me on the following activities:  Blood draw for specimens, development of treatment plan with patient or surrogate, discussions with consultants, evaluation of patient's response to treatment, examination of patient, obtaining history from patient or surrogate, ordering and performing treatments and interventions, ordering and review of laboratory studies, ordering and review of radiographic studies, pulse oximetry, re-evaluation of patient's condition and review of old charts Comments:        Procedure Name: Intubation Date/Time: 31-Mar-2018 1:50 PM Performed by: Eber Hong, MD Pre-anesthesia Checklist: Patient identified, Patient being monitored, Emergency Drugs available, Timeout performed and Suction available Oxygen Delivery Method: Non-rebreather mask Preoxygenation: Pre-oxygenation with 100% oxygen Ventilation: Mask ventilation with  difficulty Laryngoscope Size: Glidescope Grade View: Grade IV Tube size: 7.5 mm Number of attempts: 2 Airway Equipment and Method: Video-laryngoscopy and Stylet Placement Confirmation: ETT inserted through vocal cords under direct vision,  Breath sounds checked- equal and bilateral and Positive ETCO2 Secured at: 23 cm Tube secured with: ETT holder Dental Injury: Teeth and Oropharynx as per pre-operative assessment  Difficulty Due To: Difficulty was unanticipated Comments:       .Central Line Date/Time: 03/31/2018 1:51 PM Performed by: Eber Hong, MD Authorized by: Eber Hong, MD   Consent:    Consent obtained:  Emergent situation Pre-procedure details:    Hand hygiene: Hand hygiene performed prior to insertion     Sterile barrier technique: All elements of maximal sterile technique followed     Skin preparation:  ChloraPrep   Skin preparation agent: Skin preparation agent completely dried prior to procedure   Anesthesia (see MAR for exact dosages):    Anesthesia method:  None Procedure details:    Location:  R femoral   Patient position:  Flat   Procedural supplies:  Triple lumen   Catheter size:  7 Fr   Landmarks identified: yes     Ultrasound guidance: no     Number of attempts:  1   Successful placement: yes   Post-procedure details:    Post-procedure:  Dressing applied and line sutured   Assessment:  Blood return through all ports and free fluid flow   Patient tolerance of procedure:  Tolerated well, no immediate complications Comments:        .Cardioversion Date/Time: Mar 31, 2018 1:51 PM Performed by: Eber Hong, MD Authorized by: Eber Hong, MD   Consent:    Consent obtained:  Emergent situation Pre-procedure details:    Cardioversion basis:  Emergent   Pre-procedure rhythm: fine V fib.   Electrode placement:  Anterior-lateral Patient sedated: No Attempt one:    Cardioversion mode:  Asynchronous   Waveform:  Biphasic   Shock (Joules):  200    Shock outcome:  Conversion to other rhythm Comments:     Converted to wide complex rhythm with pulse at 50       Cardiopulmonary Resuscitation (CPR)  Procedure Note Directed/Performed by: Vida RollerBrian D Ajmal Kathan I personally directed ancillary staff and/or performed CPR in an effort to regain return of spontaneous circulation and to maintain cardiac, neuro and systemic perfusion.    Medications Ordered in ED Medications  EPINEPHrine (ADRENALIN) 4 mg in dextrose 5 % 250 mL (0.016 mg/mL) infusion (9 mcg/min Intravenous New Bag/Given 03/27/2018 1416)  sodium bicarbonate 150 mEq in dextrose 5 % 1,000 mL infusion ( Intravenous New Bag/Given 04/06/2018 1455)  sodium bicarbonate injection 50 mEq (50 mEq Intravenous Given 03/22/2018 1407)  EPINEPHrine (ADRENALIN) 1 mg (1 mg Intravenous Given 03/31/2018 1315)  sodium bicarbonate injection 50 mEq (50 mEq Intravenous Given 04/08/2018 1404)  calcium chloride 1 g in sodium chloride 0.9 % 100 mL IVPB (1 g Intravenous Not Given 04/02/2018 1457)  calcium chloride injection 1 g (1 g Intravenous Given 04/19/2018 1325)  EPINEPHrine (ADRENALIN) 1 mg (1 mg Intravenous Given 03/25/2018 1520)     Initial Impression / Assessment and Plan / ED Course  I have reviewed the triage vital signs and the nursing notes.  Pertinent labs & imaging results that were available during my care of the patient were reviewed by me and considered in my medical decision making (see chart for details).    The patient is in cardiac arrest, CPR was directed by myself, I intubated the patient with a 7.5 ET tube, placed a central line for access and IV fluids and epinephrine were given with spontaneous return of circulation after defibrillation with what appeared to be a brief episode of disorganized electricity considered to be rough V. fib.  The patient did not have a response in her mental status.  The patient is critically ill.  There are no family members here at this time.  She is currently on an epinephrine drip,  she is going to the CT scanner to look for other causes of possible cardiac arrest including subarachnoid hemorrhage, we have discussed her care with poison control, this does not appear to be consistent with a calcium channel blocker ingestion, that mixed with the fact that the patient had not been complaining of depression or acting depressed according to his significant other makes this less likely.  After multiple repeat evaluations the patient has required 2 pressors and is currently getting both an epinephrine drip as well as Levophed.  Pressure is around 75 systolic, IV fluids, labs show a significant and severe metabolic acidosis, currently on a bicarb drip.  Lactic acid is 11, drug screen positive only for benzodiazepines.  Care was discussed with Dr. Franchot Erichsenosenblatt who has accepted the patient [facility]  3:05 PM     Final Clinical Impressions(s) / ED Diagnoses   Final diagnoses:  Cardiac arrest [facility](HCC)  Acute renal failure, unspecified acute renal failure type (HCC)  Shock liver  Metabolic acidosis      Eber HongMiller, Jakai Onofre, MD 2018/05/21 1505    Eber HongMiller, Yunuen Mordan, MD 2018/05/21 1515    Eber HongMiller, Aaidyn San, MD 04/18/18 1453

## 2018-03-28 NOTE — ED Notes (Signed)
Poison control reported would fax over other recommendations and also verbalized obtaining bedside echo. EDP aware and gave verbal order for second amp of bicarb to be administered.

## 2018-03-28 NOTE — ED Notes (Signed)
Initially 14 OG tube inserted and removed due to Xray.  Reinserted 18 OG  With large amt brown substance suctioned

## 2018-03-28 NOTE — ED Notes (Signed)
Venous blood gases Ph 7.086 Co2 37.5 p02 160 Sat 98.2 Bicarb 11 Base deficit  17.2

## 2018-03-28 NOTE — ED Notes (Addendum)
Per Poison Control,   If citalapram, watch for drowsy bradycardia prolonged QTc watch for seizures, dysrhythmias for up to 24 hours  If diltiazem, watch for bradycardia hypotension serial EKGs for 6 hours  If gabapentin, watch for CNS Depression, hypotension  If diazepam, watch for CNS and Respiratory Depression  If procuramate, watch for drowsiness, hypotension, hyperthyroidism, auditory hallucinations, metabolic acidosis  Recommendations: draw tylenol, ETOH, aspirin, CMP, magnesium, lactic acid, CK labs Start fluids, avoid antipsychotic, 24 hour observation, optimize potassium and magnesium levels to high side of normal, bicarb 1-2 mEq push to treat ORS >120  EDP aware of recommendations.

## 2018-03-28 NOTE — ED Notes (Signed)
[facility]ole [facility].

## 2018-03-28 NOTE — Progress Notes (Signed)
ANTICOAGULATION CONSULT NOTE - Initial Consult  Pharmacy Consult for Heparin Indication: chest pain/ACS  Allergies  Allergen Reactions  . Cefaclor Hives  . Nitrofurantoin Hives and Nausea Only  . Ciclonium   . Macrodantin  [Nitrofurantoin Macrocrystal] Nausea Only  . Doxycycline     Other reaction(s): Abdominal Pain Other reaction(s): Abdominal Pain Other reaction(s): Abdominal Pain     Patient Measurements: Height: 5\' 7"  (170.2 cm) Weight: 219 lb 12.8 oz (99.7 kg) IBW/kg (Calculated) : 61.6 Heparin Dosing Weight: 83 kg  Vital Signs: Temp: 91 F (32.8 C) (04/07 1900) Temp Source: Bladder (04/07 1800) BP: 123/55 (04/07 1945) Pulse Rate: 78 (04/07 1945)  Labs: Recent Labs    04/16/2018 1330 04/01/2018 1808  HGB 7.4* 8.4*  HCT 27.8* 29.6*  PLT 405* 365  APTT  --  41*  LABPROT  --  21.2*  INR  --  1.86  CREATININE 2.61* 2.74*  TROPONINI 0.06* 0.40*    Estimated Creatinine Clearance: 25.5 mL/min (A) (by C-G formula based on SCr of 2.74 mg/dL (H)).   Medical History: No past medical history on file.  Assessment: [age] YOF who presented on 4/7 s/p cardiac arrest. Pharmacy consulted to start Heparin for ACS.   The patient was not noted to be on anticoagulation PTA. Hgb 8.4 << 7.4, plts wnl. No active bleeding noted. Head CT showed no acute bleed or CVA. Noted cooling   Goal of Therapy:  Heparin level 0.3-0.7 units/ml Monitor platelets by anticoagulation protocol: Yes   Plan:  1. Heparin bolus of 4000 units x 1 2. Start Heparin at a rate of 1100 units/hr (11 ml/hr) 3. Will continue to monitor for any signs/symptoms of bleeding and will follow up with heparin level in 8 hours   Thank you for allowing pharmacy to be a part of this patient's care.  Georgina PillionElizabeth Lien Lyman, PharmD, BCPS Clinical Pharmacist Pager: [phone] 04/20/2018 8:40 PM

## 2018-03-28 NOTE — H&P (Addendum)
PULMONARY / CRITICAL CARE MEDICINE   Name: Courtney Norton MRN: [id number] DOB: 08/09/1956    ADMISSION DATE:  04/23/18    REFERRING MD:  None  CHIEF COMPLAINT: Cardiac Arrest  HISTORY OF PRESENT ILLNESS:   The patient  Was found by her significant other frothing at the mouth after he had returned to the hotel rooom after stepping out for a few minutes from what I have been informed. The patient was unarousable and did not appear to be breathing or have a pulse. CPR was started and an ambulance was summoned.  The patient atone time time had coarse v fib and was shocked. Her intial rhythm was a wide complex bradycardia after ROSC. The patient was found to have a profound acidosis. She required bicarb pushes and was placed on a bicarb drip. The patient had a CVP placed in the right groin. She is on Levophed 20 mcg/min and epinephrine 9 mcg/min.  She has elevated LFTs likely due to shock liver. CT of the head was unremarkable. He r intiual trop was 0.06.  The patient apparently had been admitted to the hospital 1 month ago fgor anemia and atrial fibrillation.   PAST MEDICAL HISTORY :  She  has no past medical history on file.  PAST SURGICAL HISTORY: She  has no past surgical history on file.  Allergies  Allergen Reactions  . Cefaclor Hives  . Nitrofurantoin Hives and Nausea Only  . Ciclonium   . Macrodantin  [Nitrofurantoin Macrocrystal] Nausea Only  . Doxycycline     Other reaction(s): Abdominal Pain Other reaction(s): Abdominal Pain Other reaction(s): Abdominal Pain     No current facility-administered medications on file prior to encounter.    Current Outpatient Medications on File Prior to Encounter  Medication Sig  . aspirin EC 81 MG tablet Take 81 mg by mouth daily.  . citalopram (CELEXA) 40 MG tablet Take 40 mg by mouth daily.  . diazepam (VALIUM) 2 MG tablet Take 2 mg by mouth every 8 (eight) hours as needed for anxiety.  Marland Kitchen diltiazem (CARDIZEM CD) 120  MG 24 hr capsule Take by mouth.  . gabapentin (NEURONTIN) 300 MG capsule Take 300 mg by mouth 3 (three) times daily.  Marland Kitchen omeprazole (PRILOSEC) 40 MG capsule Take 40 mg by mouth daily.  Marland Kitchen topiramate (TOPAMAX) 50 MG tablet Take 50 mg by mouth 2 (two) times daily.    FAMILY HISTORY:  Her has no family status information on file.  According to her significant other there is no close family.  SOCIAL HISTORY: Unknown REVIEW OF SYSTEMS:   Not available as the patient is comatose     VITAL SIGNS: BP (!) 142/69   Pulse 88   Temp (!) 92.3 F (33.5 C)   Resp (!) 22   Ht 5\' 7"  (1.702 m)   Wt 219 lb 12.8 oz (99.7 kg)   SpO2 99%   BMI 34.43 kg/m   HEMODYNAMICS:  Patient was in profound shock. When she arrived she was on 20 mcg levophed and 9 mcg/min of epinephrine.  VENTILATOR SETTINGS: Vent Mode: PRVC FiO2 (%):  [1 %-100 %] 60 % Set Rate:  [14 bmp-24 bmp] 14 bmp Vt Set:  [490 mL-560 mL] 490 mL PEEP:  [5 cmH20] 5 cmH20 Plateau Pressure:  [14 cmH20] 14 cmH20  INTAKE / OUTPUT: No intake/output data recorded.  PHYSICAL EXAMINATION: General: Patient is comatose on ventialtor Neuro:Not moving at this time. May be somw quivering of lip and right eye suggesting seizure activity  Pupils are 8mm and non reactive  GCS of 3. Patient is overbreathing the ventialtor  HEENT:  [place]/AT, sclerae anicteric,  Cardiovascular:  RRR s1S2 Lungs:  Bilateral; BS Abdomen:  Soft , BS, non-tender, no gross oreganomegaly Musculoskeletal:  Not moving, no gross atrophy Exttr; no cyanosis or edema, CVP in the righht groin Skin:  Dry and warm  LABS:  BMET Recent Labs  Lab 04/08/2018 1330  NA 141  K 3.9  CL 107  CO2 14*  BUN 28*  CREATININE 2.61*  GLUCOSE 275*    Electrolytes Recent Labs  Lab 04/14/2018 1330  CALCIUM 9.9    CBC Recent Labs  Lab 04/11/2018 1330  WBC 13.6*  HGB 7.4*  HCT 27.8*  PLT 405*    Coag's No results for input(s): APTT, INR in the last 168 hours.  Sepsis  Markers Recent Labs  Lab 03/31/2018 1423 04/18/2018 1547  LATICACIDVEN 11.06* 9.00*    ABG Recent Labs  Lab 03/27/2018 1412 03/26/2018 1621  PHART 6.916* 7.086*  PCO2ART 49.4* 37.5  PO2ART 185* 160*    Liver Enzymes Recent Labs  Lab 04/02/2018 1330  AST 2,119*  ALT 2,720*  ALKPHOS 92  BILITOT 0.7  ALBUMIN 2.7*    Cardiac Enzymes Recent Labs  Lab 04/02/2018 1330  TROPONINI 0.06*    Glucose No results for input(s): GLUCAP in the last 168 hours.  Imaging Ct Head Wo Contrast  Result Date: 04/09/2018 CLINICAL DATA:  62 year old female found unconscious in a motel EXAM: CT HEAD WITHOUT CONTRAST TECHNIQUE: Contiguous axial images were obtained from the base of the skull through the vertex without intravenous contrast. COMPARISON:  None. FINDINGS: Brain: No evidence of acute infarction, hemorrhage, hydrocephalus, extra-axial collection or mass lesion/mass effect. Mild cerebral cortical atrophy, slightly advanced for age. Vascular: No hyperdense vessel or unexpected calcification. Skull: Normal. Negative for fracture or focal lesion. Sinuses/Orbits: No acute finding. Other: None. IMPRESSION: No acute intracranial abnormality. Mild cerebral cortical atrophy greater than expected for age. Electronically Signed   By: Malachy Moan M.D.   On: 04/10/2018 14:09   Dg Chest Portable 1 View  Result Date: 04/08/2018 CLINICAL DATA:  Respiratory failure, intubation. EXAM: PORTABLE CHEST 1 VIEW COMPARISON:  None. FINDINGS: Endotracheal tube tip 2.9 cm above the carina. Tubing with a side-port resembling a nasogastric tube extends further to the left distally than would be expected for the normal esophageal contour, about 5 cm to the left of midline. There is some indistinct retrocardiac airspace opacity. Aortopulmonary window is indistinct. Low lung volumes are present, causing crowding of the pulmonary vasculature. Mild prominence of the right upper paratracheal tissues. Right upper rib  discontinuities are likely chronic laterally, and involve the second, third, fourth, and possibly the first ribs, with some displacement. Upper normal heart size. IMPRESSION: 1. The endotracheal tube appears satisfactorily positioned. 2. Additional tubing which resembles a nasogastric tube extends 5 cm to the left of midline distally. This could be in a large hiatal hernia. I am skeptical that it is in the tracheobronchial tree. Position of this tube could be ascertained with chest CT if clinically warranted. 3. There is some retrocardiac density which could be from pneumonia, atelectasis, or a hiatal hernia. 4. Low lung volumes are present, causing crowding of the pulmonary vasculature. 5. Poor definition of the AP window, possibly due to low lung volumes but nonspecific. Similarly there is right paratracheal mediastinal prominence which could be from adenopathy but may be incidental. 6. Multiple chronic appearing displaced rib discontinuities in the  upper right thorax laterally. Electronically Signed   By: Gaylyn RongWalter  Liebkemann M.D.   On: May 13, 2018 13:55       LINES/TUBES:  oral ET Arterial line.  CVP OGT   ASSESSMENT / PLAN:  PULMONARY  Acute respiratory failure.  Patient was intubated in the field.  No obvious CHF or pneumonia.Because of the picture of undifferentiated shock and high FI02 requirements with a relatively clear CXR I decided the patient should be anticoaghulated. CTA cannot be done in view of her current renal dysfn.  CARDIOVASCULAR Patient is s/p cardiac arrest. The etiology is unclear at this time.  initial troponin was negative. Her ECG was unramarakble. This suggests that the patient likely did no thave a large MI. Patient will need ECHO.  I attempted a bedside ECHO.Her LV appeared to have moderately good overall wall motion but was not able to determine other useful info due to poor windows..The patient is having targeted temp management with a goal of 33 C. The patient  actually presented with a temp of 33. Will ask cardiology to see thepatient. No interventions planned at this time     RENAL  there appears to be an element of renal failure with a creat of 2.61. I do not know if there has been pre-existing renal disease. Will monitor. May be the result of profound shock. No hisoptry as far as we know of CKD.    HEMATOLOGIC   The patient is anemic with Hb a of 7.4. There was no description or history of recent gi bleed. Will not transfuse at this time  Endocrine. Blood sugars are elevated . Patient placed on glucometers with coverage  NEUROLOGIC It is not entirely clear if the patient may have had significant down time before she was discovered by her friend. At present the patient is unresponsive. She had an apparent cardiac arrest. Her pupils remain dilalted. The total down time before ENS was judged to be at least 8 minutes. It is not clear how long the patient was resuscitated before restotration ROSC. The patient amy have sustained significant anoxic injury. Patient given Therese Sarahtiubvan on arrival and started on Keppra. EEG ordered.  INFX The picture is not consistent with a septic type of presentation. Nevertheless, the picture is murky. The patient has a major AG acidosis. There could perhaps be a source of sepsis and the patient lekely needs empiric abx coverage until the clinical picture clarifies. Given her multiple abx allergies I have chosen Clindamycin and Azactam for the time being.  Acidosis The patient has a major metabolic acidosis from pro;longed shock. There may be a contributioin on the basi oif her renal failure as well.    FAMILY  I will attempt to talk to her significant other.     Jamesetta Soobert Emmani Lesueur MD Pulmonary and Critical Care Medicine [facility] Pager: [phone]  04/09/2018, 6:02 PM

## 2018-03-28 NOTE — Progress Notes (Signed)
Critical ABG values given to Dr. Rosenblatt. 

## 2018-03-28 NOTE — ED Triage Notes (Signed)
Pt arrived via [facility] with CPR in progress after being found down at a hotel.  Husband stepped outside and came back and found her drooling and unresponsive with no pulse.  Approximate 8 minute downtime before cpr initiated by ems.

## 2018-03-28 NOTE — Progress Notes (Signed)
Unable to suction ETT at this time due to patient biting on the tube.  MD and RN notified.

## 2018-03-28 NOTE — Procedures (Signed)
Arterial Catheter Insertion Procedure Note Durenda GuthrieDeborah Caskey-Slamon [id number] 05/16/1956  Procedure: Insertion of Arterial Catheter  Indications: Blood pressure monitoring and Frequent blood sampling  Procedure Details Consent: Unable to obtain consent because of emergent medical necessity. Time Out: Verified patient identification, verified procedure, site/side was marked, verified correct patient position, special equipment/implants available, medications/allergies/relevent history reviewed, required imaging and test results available.  Performed  Maximum sterile technique was used including antiseptics, cap, gloves, gown, hand hygiene, mask and sheet. Skin prep: Chlorhexidine; local anesthetic administered 20 gauge catheter was inserted into right radial artery using the Seldinger technique. ULTRASOUND GUIDANCE USED: NO Evaluation Blood flow good; BP tracing good. Complications: No apparent complications.   Italyhad M Layna Roeper 04/05/2018

## 2018-03-28 NOTE — ED Notes (Signed)
No warming measures per Dr. Hyacinth MeekerMiller.

## 2018-03-28 NOTE — ED Notes (Signed)
Pt back in room with CT. RT also remains at bedside.

## 2018-03-29 ENCOUNTER — Inpatient Hospital Stay (HOSPITAL_COMMUNITY): Payer: Self-pay

## 2018-03-29 DIAGNOSIS — I361 Nonrheumatic tricuspid (valve) insufficiency: Secondary | ICD-10-CM

## 2018-03-29 DIAGNOSIS — I469 Cardiac arrest, cause unspecified: Secondary | ICD-10-CM

## 2018-03-29 LAB — CBC
HCT: 27.9 % — ABNORMAL LOW (ref 36.0–46.0)
Hemoglobin: 7.8 g/dL — ABNORMAL LOW (ref 12.0–15.0)
MCH: 18.3 pg — ABNORMAL LOW (ref 26.0–34.0)
MCHC: 28 g/dL — ABNORMAL LOW (ref 30.0–36.0)
MCV: 65.3 fL — ABNORMAL LOW (ref 78.0–100.0)
PLATELETS: 298 10*3/uL (ref 150–400)
RBC: 4.27 MIL/uL (ref 3.87–5.11)
RDW: 27.5 % — ABNORMAL HIGH (ref 11.5–15.5)
WBC: 3.8 10*3/uL — AB (ref 4.0–10.5)

## 2018-03-29 LAB — COMPREHENSIVE METABOLIC PANEL
ALT: 2306 U/L — ABNORMAL HIGH (ref 14–54)
AST: 3117 U/L — ABNORMAL HIGH (ref 15–41)
Albumin: 2 g/dL — ABNORMAL LOW (ref 3.5–5.0)
Alkaline Phosphatase: 79 U/L (ref 38–126)
Anion gap: 17 — ABNORMAL HIGH (ref 5–15)
BUN: 40 mg/dL — ABNORMAL HIGH (ref 6–20)
CO2: 18 mmol/L — ABNORMAL LOW (ref 22–32)
Calcium: 7 mg/dL — ABNORMAL LOW (ref 8.9–10.3)
Chloride: 100 mmol/L — ABNORMAL LOW (ref 101–111)
Creatinine, Ser: 3.02 mg/dL — ABNORMAL HIGH (ref 0.44–1.00)
GFR calc Af Amer: 18 mL/min — ABNORMAL LOW (ref >60)
GFR calc non Af Amer: 15 mL/min — ABNORMAL LOW (ref >60)
Glucose, Bld: 422 mg/dL — ABNORMAL HIGH (ref 65–99)
Potassium: 3.1 mmol/L — ABNORMAL LOW (ref 3.5–5.1)
Sodium: 135 mmol/L (ref 135–145)
Total Bilirubin: 0.7 mg/dL (ref 0.3–1.2)
Total Protein: 4.3 g/dL — ABNORMAL LOW (ref 6.5–8.1)

## 2018-03-29 LAB — GLUCOSE, CAPILLARY
GLUCOSE-CAPILLARY: 356 mg/dL — AB (ref 65–99)
GLUCOSE-CAPILLARY: 370 mg/dL — AB (ref 65–99)
GLUCOSE-CAPILLARY: 386 mg/dL — AB (ref 65–99)
GLUCOSE-CAPILLARY: 395 mg/dL — AB (ref 65–99)
GLUCOSE-CAPILLARY: 415 mg/dL — AB (ref 65–99)
GLUCOSE-CAPILLARY: 420 mg/dL — AB (ref 65–99)
GLUCOSE-CAPILLARY: 387 mg/dL — AB (ref 65–99)
GLUCOSE-CAPILLARY: 407 mg/dL — AB (ref 65–99)
GLUCOSE-CAPILLARY: 389 mg/dL — AB (ref 65–99)
GLUCOSE-CAPILLARY: 293 mg/dL — AB (ref 65–99)
GLUCOSE-CAPILLARY: 273 mg/dL — AB (ref 65–99)
Glucose-Capillary: 233 mg/dL — ABNORMAL HIGH (ref 65–99)
Glucose-Capillary: 333 mg/dL — ABNORMAL HIGH (ref 65–99)
Glucose-Capillary: 344 mg/dL — ABNORMAL HIGH (ref 65–99)
Glucose-Capillary: 377 mg/dL — ABNORMAL HIGH (ref 65–99)
Glucose-Capillary: 379 mg/dL — ABNORMAL HIGH (ref 65–99)
Glucose-Capillary: 390 mg/dL — ABNORMAL HIGH (ref 65–99)
Glucose-Capillary: 390 mg/dL — ABNORMAL HIGH (ref 65–99)
Glucose-Capillary: 396 mg/dL — ABNORMAL HIGH (ref 65–99)
Glucose-Capillary: 397 mg/dL — ABNORMAL HIGH (ref 65–99)
Glucose-Capillary: 406 mg/dL — ABNORMAL HIGH (ref 65–99)
Glucose-Capillary: 408 mg/dL — ABNORMAL HIGH (ref 65–99)
Glucose-Capillary: 421 mg/dL — ABNORMAL HIGH (ref 65–99)
Glucose-Capillary: 434 mg/dL — ABNORMAL HIGH (ref 65–99)

## 2018-03-29 LAB — MAGNESIUM
Magnesium: 1.7 mg/dL (ref 1.7–2.4)
Magnesium: 1.7 mg/dL (ref 1.7–2.4)
Magnesium: 2.3 mg/dL (ref 1.7–2.4)
Magnesium: 1.9 mg/dL (ref 1.7–2.4)
Magnesium: 1.7 mg/dL (ref 1.7–2.4)

## 2018-03-29 LAB — PHOSPHORUS
Phosphorus: 1 mg/dL — CL (ref 2.5–4.6)
Phosphorus: 1.4 mg/dL — ABNORMAL LOW (ref 2.5–4.6)
Phosphorus: 3.8 mg/dL (ref 2.5–4.6)
Phosphorus: 4.6 mg/dL (ref 2.5–4.6)

## 2018-03-29 LAB — BASIC METABOLIC PANEL
Anion gap: 14 (ref 5–15)
Anion gap: 17 — ABNORMAL HIGH (ref 5–15)
Anion gap: 19 — ABNORMAL HIGH (ref 5–15)
Anion gap: 19 — ABNORMAL HIGH (ref 5–15)
Anion gap: 13 (ref 5–15)
BUN: 34 mg/dL — ABNORMAL HIGH (ref 6–20)
BUN: 36 mg/dL — ABNORMAL HIGH (ref 6–20)
BUN: 38 mg/dL — AB (ref 6–20)
BUN: 38 mg/dL — AB (ref 6–20)
BUN: 40 mg/dL — AB (ref 6–20)
CALCIUM: 7.3 mg/dL — AB (ref 8.9–10.3)
CALCIUM: 7.3 mg/dL — AB (ref 8.9–10.3)
CHLORIDE: 101 mmol/L (ref 101–111)
CHLORIDE: 101 mmol/L (ref 101–111)
CO2: 19 mmol/L — AB (ref 22–32)
CO2: 18 mmol/L — AB (ref 22–32)
CO2: 17 mmol/L — AB (ref 22–32)
CO2: 17 mmol/L — ABNORMAL LOW (ref 22–32)
CO2: 19 mmol/L — ABNORMAL LOW (ref 22–32)
CREATININE: 2.83 mg/dL — AB (ref 0.44–1.00)
CREATININE: 2.9 mg/dL — AB (ref 0.44–1.00)
CREATININE: 3.01 mg/dL — AB (ref 0.44–1.00)
CREATININE: 3 mg/dL — AB (ref 0.44–1.00)
CREATININE: 3.01 mg/dL — AB (ref 0.44–1.00)
Calcium: 7.3 mg/dL — ABNORMAL LOW (ref 8.9–10.3)
Calcium: 7.3 mg/dL — ABNORMAL LOW (ref 8.9–10.3)
Calcium: 6.8 mg/dL — ABNORMAL LOW (ref 8.9–10.3)
Chloride: 106 mmol/L (ref 101–111)
Chloride: 104 mmol/L (ref 101–111)
Chloride: 101 mmol/L (ref 101–111)
GFR calc Af Amer: 19 mL/min — ABNORMAL LOW (ref >60)
GFR calc Af Amer: 18 mL/min — ABNORMAL LOW (ref >60)
GFR calc Af Amer: 18 mL/min — ABNORMAL LOW (ref >60)
GFR calc non Af Amer: 17 mL/min — ABNORMAL LOW (ref >60)
GFR calc non Af Amer: 16 mL/min — ABNORMAL LOW (ref >60)
GFR calc non Af Amer: 15 mL/min — ABNORMAL LOW (ref >60)
GFR calc non Af Amer: 16 mL/min — ABNORMAL LOW (ref >60)
GFR calc non Af Amer: 15 mL/min — ABNORMAL LOW (ref >60)
GFR, EST AFRICAN AMERICAN: 19 mL/min — AB (ref >60)
GFR, EST AFRICAN AMERICAN: 18 mL/min — AB (ref >60)
GLUCOSE: 419 mg/dL — AB (ref 65–99)
GLUCOSE: 443 mg/dL — AB (ref 65–99)
GLUCOSE: 423 mg/dL — AB (ref 65–99)
GLUCOSE: 352 mg/dL — AB (ref 65–99)
Glucose, Bld: 410 mg/dL — ABNORMAL HIGH (ref 65–99)
Potassium: 2.2 mmol/L — CL (ref 3.5–5.1)
Potassium: <2 mmol/L — CL (ref 3.5–5.1)
Potassium: <2 mmol/L — CL (ref 3.5–5.1)
Potassium: 2.5 mmol/L — CL (ref 3.5–5.1)
Potassium: 3.6 mmol/L (ref 3.5–5.1)
SODIUM: 137 mmol/L (ref 135–145)
Sodium: 139 mmol/L (ref 135–145)
Sodium: 139 mmol/L (ref 135–145)
Sodium: 137 mmol/L (ref 135–145)
Sodium: 133 mmol/L — ABNORMAL LOW (ref 135–145)

## 2018-03-29 LAB — BLOOD GAS, ARTERIAL
ACID-BASE DEFICIT: 8.4 mmol/L — AB (ref 0.0–2.0)
ACID-BASE DEFICIT: 6.1 mmol/L — AB (ref 0.0–2.0)
BICARBONATE: 18.8 mmol/L — AB (ref 20.0–28.0)
Bicarbonate: 19.8 mmol/L — ABNORMAL LOW (ref 20.0–28.0)
Drawn by: 517021
FIO2: 0.6
FIO2: 60
LHR: 18 {breaths}/min
MECHVT: 490 mL
MECHVT: 490 mL
O2 SAT: 96.6 %
O2 Saturation: 97.4 %
PATIENT TEMPERATURE: 98.6
PCO2 ART: 54.3 mmHg — AB (ref 32.0–48.0)
PEEP/CPAP: 5 cmH2O
PEEP: 5 cmH2O
PH ART: 7.262 — AB (ref 7.350–7.450)
PO2 ART: 110 mmHg — AB (ref 83.0–108.0)
PO2 ART: 88.5 mmHg (ref 83.0–108.0)
Patient temperature: 98.6
RATE: 24 resp/min
pCO2 arterial: 45.4 mmHg (ref 32.0–48.0)
pH, Arterial: 7.165 — CL (ref 7.350–7.450)

## 2018-03-29 LAB — APTT: aPTT: 104 seconds — ABNORMAL HIGH (ref 24–36)

## 2018-03-29 LAB — HEPARIN LEVEL (UNFRACTIONATED)
HEPARIN UNFRACTIONATED: 0.31 [IU]/mL (ref 0.30–0.70)
Heparin Unfractionated: 0.24 IU/mL — ABNORMAL LOW (ref 0.30–0.70)

## 2018-03-29 LAB — HEPATIC FUNCTION PANEL
ALBUMIN: 2.3 g/dL — AB (ref 3.5–5.0)
ALT: 2814 U/L — ABNORMAL HIGH (ref 14–54)
AST: 3608 U/L — ABNORMAL HIGH (ref 15–41)
Alkaline Phosphatase: 84 U/L (ref 38–126)
BILIRUBIN INDIRECT: 0.4 mg/dL (ref 0.3–0.9)
BILIRUBIN TOTAL: 0.7 mg/dL (ref 0.3–1.2)
Bilirubin, Direct: 0.3 mg/dL (ref 0.1–0.5)
TOTAL PROTEIN: 5 g/dL — AB (ref 6.5–8.1)

## 2018-03-29 LAB — PROTIME-INR
INR: 2.19
INR: 2.19
PROTHROMBIN TIME: 24.2 s — AB (ref 11.4–15.2)
Prothrombin Time: 24.2 seconds — ABNORMAL HIGH (ref 11.4–15.2)

## 2018-03-29 LAB — LIPASE, BLOOD: Lipase: 154 U/L — ABNORMAL HIGH (ref 11–51)

## 2018-03-29 LAB — PROCALCITONIN: Procalcitonin: 28.93 ng/mL

## 2018-03-29 LAB — AMYLASE: Amylase: 513 U/L — ABNORMAL HIGH (ref 28–100)

## 2018-03-29 LAB — TROPONIN I
TROPONIN I: 1.1 ng/mL — AB (ref <0.03)
Troponin I: 1.21 ng/mL (ref <0.03)

## 2018-03-29 LAB — LACTIC ACID, PLASMA
Lactic Acid, Venous: 8.4 mmol/L (ref 0.5–1.9)
Lactic Acid, Venous: 6.5 mmol/L (ref 0.5–1.9)
Lactic Acid, Venous: 5.7 mmol/L (ref 0.5–1.9)

## 2018-03-29 LAB — ECHOCARDIOGRAM COMPLETE
HEIGHTINCHES: 68 in
WEIGHTICAEL: 3668.45 [oz_av]

## 2018-03-29 LAB — HIV ANTIBODY (ROUTINE TESTING W REFLEX): HIV SCREEN 4TH GENERATION: NONREACTIVE

## 2018-03-29 MED ORDER — VASOPRESSIN 20 UNIT/ML IV SOLN
0.0300 [IU]/min | INTRAVENOUS | Status: DC
  Administered 2018-03-29 – 2018-03-30 (×2): 0.03 [IU]/min via INTRAVENOUS
  Filled 2018-03-29 (×3): qty 2

## 2018-03-29 MED ORDER — HYDROCORTISONE NA SUCCINATE PF 100 MG IJ SOLR: 100.0000 mg | Freq: Three times a day (TID) | INTRAMUSCULAR | Status: DC

## 2018-03-29 MED ORDER — ORAL CARE MOUTH RINSE
15.0000 mL | Freq: Four times a day (QID) | OROMUCOSAL | Status: DC
  Administered 2018-03-29 – 2018-03-30 (×7): 15 mL via OROMUCOSAL

## 2018-03-29 MED ORDER — INSULIN DETEMIR 100 UNIT/ML ~~LOC~~ SOLN
15.0000 [IU] | Freq: Two times a day (BID) | SUBCUTANEOUS | Status: DC
  Administered 2018-03-29 – 2018-03-31 (×5): 15 [IU] via SUBCUTANEOUS
  Filled 2018-03-29 (×9): qty 0.15

## 2018-03-29 MED ORDER — POTASSIUM PHOSPHATES 15 MMOLE/5ML IV SOLN
40.0000 meq | Freq: Once | INTRAVENOUS | Status: AC
  Administered 2018-03-29: 40 meq via INTRAVENOUS
  Filled 2018-03-29: qty 9.09

## 2018-03-29 MED ORDER — VANCOMYCIN HCL 10 G IV SOLR
1500.0000 mg | Freq: Once | INTRAVENOUS | Status: AC
  Administered 2018-03-29: 1500 mg via INTRAVENOUS
  Filled 2018-03-29: qty 1500

## 2018-03-29 MED ORDER — MAGNESIUM SULFATE 2 GM/50ML IV SOLN
2.0000 g | Freq: Once | INTRAVENOUS | Status: AC
  Administered 2018-03-29: 2 g via INTRAVENOUS
  Filled 2018-03-29: qty 50

## 2018-03-29 MED ORDER — POTASSIUM CHLORIDE 10 MEQ/50ML IV SOLN
10.0000 meq | INTRAVENOUS | Status: AC
  Administered 2018-03-29 (×6): 10 meq via INTRAVENOUS
  Filled 2018-03-29 (×5): qty 50

## 2018-03-29 MED ORDER — POTASSIUM CHLORIDE 10 MEQ/50ML IV SOLN
10.0000 meq | INTRAVENOUS | Status: AC
  Administered 2018-03-29 (×6): 10 meq via INTRAVENOUS

## 2018-03-29 MED ORDER — POTASSIUM CHLORIDE 10 MEQ/50ML IV SOLN
10.0000 meq | INTRAVENOUS | Status: DC
  Filled 2018-03-29: qty 50

## 2018-03-29 MED ORDER — VANCOMYCIN HCL IN DEXTROSE 1-5 GM/200ML-% IV SOLN
1000.0000 mg | INTRAVENOUS | Status: DC
  Administered 2018-03-30: 1000 mg via INTRAVENOUS
  Filled 2018-03-29 (×2): qty 200

## 2018-03-29 MED ORDER — PANTOPRAZOLE SODIUM 40 MG IV SOLR
40.0000 mg | Freq: Every day | INTRAVENOUS | Status: DC
  Administered 2018-03-29 – 2018-04-02 (×5): 40 mg via INTRAVENOUS
  Filled 2018-03-29 (×5): qty 40

## 2018-03-29 MED ORDER — POTASSIUM CHLORIDE 10 MEQ/50ML IV SOLN
INTRAVENOUS | Status: AC
  Administered 2018-03-29: 10 meq
  Filled 2018-03-29: qty 50

## 2018-03-29 MED ORDER — SODIUM CHLORIDE 0.9 % IV BOLUS
500.0000 mL | Freq: Once | INTRAVENOUS | Status: AC
  Administered 2018-03-29: 500 mL via INTRAVENOUS

## 2018-03-29 MED ORDER — POTASSIUM CHLORIDE 10 MEQ/50ML IV SOLN
10.0000 meq | INTRAVENOUS | Status: AC
  Administered 2018-03-29 (×6): 10 meq via INTRAVENOUS
  Filled 2018-03-29 (×4): qty 50

## 2018-03-29 MED ORDER — SODIUM BICARBONATE 8.4 % IV SOLN
100.0000 meq | Freq: Once | INTRAVENOUS | Status: AC
  Administered 2018-03-29: 100 meq via INTRAVENOUS
  Filled 2018-03-29: qty 100

## 2018-03-29 MED ORDER — MAGNESIUM SULFATE IN D5W 1-5 GM/100ML-% IV SOLN
1.0000 g | Freq: Once | INTRAVENOUS | Status: AC
  Administered 2018-03-29: 1 g via INTRAVENOUS
  Filled 2018-03-29: qty 100

## 2018-03-29 MED ORDER — CHLORHEXIDINE GLUCONATE 0.12% ORAL RINSE (MEDLINE KIT)
15.0000 mL | Freq: Two times a day (BID) | OROMUCOSAL | Status: DC
  Administered 2018-03-29 – 2018-03-30 (×4): 15 mL via OROMUCOSAL

## 2018-03-29 MED FILL — Medication: Qty: 1 | Status: AC

## 2018-03-29 NOTE — Significant Event (Signed)
Received multiple calls from patient's significant other, wanting to give staff information about patient as well as requesting for updates. Significant other states there is a spouse and son. RN spoke with patient's son, who stated that RN can give updates to patient's significant other. Unable to reach patient's spouse at this time. Patient's son declined to create a password when asked.       Courtney Norton

## 2018-03-29 NOTE — Progress Notes (Signed)
CRITICAL VALUE ALERT  Critical Value:  Potassium 2.4, troponin 0.40  Date & Time Notied:  04/19/2018 2110 Provider Notified: Dr Arsenio LoaderSommer  Orders Received/Actions taken: no; MD will add orders

## 2018-03-29 NOTE — Progress Notes (Signed)
CRITICAL VALUE ALERT  Critical Value:  K+<2.0, phosphorus 1, troponin 1.10  Date & Time Notied:  03/29/18 0700  Provider Notified:Dr Arsenio LoaderSommer  Orders Received/Actions taken: no; MD will add orders

## 2018-03-29 NOTE — Progress Notes (Signed)
ANTICOAGULATION CONSULT NOTE - Follow Up Consult  Pharmacy Consult for Heparin Indication: chest pain/ACS  Allergies  Allergen Reactions  . Cefaclor Hives  . Nitrofurantoin Hives and Nausea Only  . Ciclonium   . Macrodantin  [Nitrofurantoin Macrocrystal] Nausea Only  . Doxycycline     Other reaction(s): Abdominal Pain Other reaction(s): Abdominal Pain Other reaction(s): Abdominal Pain     Patient Measurements: Height: 5\' 8"  (172.7 cm) Weight: 229 lb 4.5 oz (104 kg) IBW/kg (Calculated) : 63.9 Heparin Dosing Weight: 83 kg  Vital Signs: Temp: 91.4 F (33 C) (04/08 0900) Temp Source: Bladder (04/08 0900) BP: 114/57 (04/08 0930) Pulse Rate: 74 (04/08 0930)  Labs: Recent Labs    02-22-18 1330 02-22-18 1808  02-22-18 2056 02-22-18 2213 02-22-18 2236 03/29/18 0147 03/29/18 0538 03/29/18 0747  HGB 7.4* 8.4*   < > 9.9*  --  9.5*  --  7.8*  --   HCT 27.8* 29.6*   < > 29.0*  --  28.0*  --  27.9*  --   PLT 405* 365  --   --   --   --   --  298  --   APTT  --  41*  --   --   --   --  104*  --   --   LABPROT  --  21.2*  --   --   --   --  24.2*  --  24.2*  INR  --  1.86  --   --   --   --  2.19  --  2.19  HEPARINUNFRC  --   --   --   --   --   --   --  0.24* 0.31  CREATININE 2.61* 2.74*   < > 2.60* 2.83*  --  2.83* 2.90* 3.01*  TROPONINI 0.06* 0.40*  --   --  0.73*  --   --  1.10*  --    < > = values in this interval not displayed.    Estimated Creatinine Clearance: 24.1 mL/min (A) (by C-G formula based on SCr of 3.01 mg/dL (H)).   Medical History: No past medical history on file.  Assessment: [age] YOF who presented on 4/7 s/p cardiac arrest. Pharmacy consulted to start Heparin for ACS.   The patient was not noted to be on anticoagulation PTA. Hg low at BL watch no active bleeding plts wnl. Head CT showed no acute bleed or CVA. Noted TTM 33 deg LFTs elevated post arrest 3000. INR elevted at 2 post arrest Cr rising 3  Heparin drip 1100 uts/hr HL 0.3 at goal   Goal of  Therapy:  Heparin level 0.3-0.7 units/ml Monitor platelets by anticoagulation protocol: Yes   Plan:  Continue heparin drip 1100 uts/hr  Daily HL CBC Monitor s/s bleeding    Courtney SauersLisa Shauni Henner Pharm.D. CPP, BCPS Clinical Pharmacist [phone] 03/29/2018 10:13 AM

## 2018-03-29 NOTE — Progress Notes (Signed)
  Echocardiogram 2D Echocardiogram has been performed.  Leta JunglingCooper, Landry Kamath M 03/29/2018, 11:57 AM

## 2018-03-29 NOTE — Progress Notes (Signed)
eLink Physician-Brief Progress Note Patient Name: Courtney GuthrieDeborah Norton DOB: 03/08/1956 MRN: [id number]   Date of Service  03/29/2018  HPI/Events of Note  ABG on 60%/PRVC 18/TV 460/P 5 =7.165/24.3/110/18.4.  eICU Interventions  Will order: 1.  Increase PRVC rate to 24. 2. NaHCO3 100 meq IV now.  3. ABG at 5 AM.     Intervention Category Major Interventions: Respiratory failure - evaluation and management;Acid-Base disturbance - evaluation and management  Eulalio Reamy Eugene 03/29/2018, 3:03 AM

## 2018-03-29 NOTE — Progress Notes (Signed)
eLink Physician-Brief Progress Note Patient Name: Courtney GuthrieDeborah Norton DOB: 05/05/1956 MRN: [id number]   Date of Service  03/29/2018  HPI/Events of Note  Multiple issues: 1. ABG on 60%/PRVC 15/TV 490/P 5 = 7.121/51.1/64.4/17.2 and 2. K+ = 2.3 and Creatinine = 2.6.   eICU Interventions  Will order: 1. NaHCO3 100 meq IV now. 2. Increase NaHCO3 IV infusion rate to 125 meq/hour.  3. ABG at 2:30 PM.  4. Replace K+.      Intervention Category Major Interventions: Electrolyte abnormality - evaluation and management;Acid-Base disturbance - evaluation and management;Respiratory failure - evaluation and management  Ryver Zadrozny Eugene 03/29/2018, 12:04 AM

## 2018-03-29 NOTE — Progress Notes (Signed)
eLink Physician-Brief Progress Note Patient Name: Courtney GuthrieDeborah Norton DOB: 04/11/1956 MRN: [id number]   Date of Service  03/29/2018  HPI/Events of Note  Lactic Acid = 6.5 --> 5.7.   eICU Interventions  Will order: 1. Bolus with 0.9 NaCl 500 mL IV over 30 minutes now.      Intervention Category Major Interventions: Acid-Base disturbance - evaluation and management  Sommer,Steven Eugene 03/29/2018, 10:08 PM

## 2018-03-29 NOTE — Progress Notes (Signed)
CRITICAL VALUE ALERT  Critical Value: Lactic Acid 5.7  Date & Time Notied: 03/29/18 2209  Provider Notified: Dr. Arsenio LoaderSommer  Orders Received/Actions taken: 500cc Bolus

## 2018-03-29 NOTE — Progress Notes (Signed)
Pharmacy Antibiotic Note  Courtney Norton is a 62 y.o. female admitted on 03/22/2018 s/p cardiac arrest now intubated and sedated and TTm at 33deg.  WBC elevated on admit and possible pna started on aztreonam and clindamycin.  Will add vancomycin given acute illness.  Cr rising up 3 post arrest.    Plan: Vancomycin 1500mg  iv x1 then 1gm q24h Aztreonam 1gm q8h Clindamycin 600mg  q8h  Height: 5\' 8"  (172.7 cm) Weight: 229 lb 4.5 oz (104 kg) IBW/kg (Calculated) : 63.9  Temp (24hrs), Avg:91.8 F (33.2 C), Min:90.3 F (32.4 C), Max:93.6 F (34.2 C)  Recent Labs  Lab 02/01/18 1330 02/01/18 1423 02/01/18 1547 02/01/18 1808 02/01/18 1816  02/01/18 2056 02/01/18 2213 03/29/18 0147 03/29/18 0538 03/29/18 0747  WBC 13.6*  --   --  15.4*  --   --   --   --   --  3.8*  --   CREATININE 2.61*  --   --  2.74*  --    < > 2.60* 2.83* 2.83* 2.90* 3.01*  LATICACIDVEN  --  11.06* 9.00*  --  9.3*  --   --   --   --   --   --    < > = values in this interval not displayed.    Estimated Creatinine Clearance: 24.1 mL/min (A) (by C-G formula based on SCr of 3.01 mg/dL (H)).    Allergies  Allergen Reactions  . Cefaclor Hives  . Nitrofurantoin Hives and Nausea Only  . Ciclonium   . Macrodantin  [Nitrofurantoin Macrocrystal] Nausea Only  . Doxycycline     Other reaction(s): Abdominal Pain Other reaction(s): Abdominal Pain Other reaction(s): Abdominal Pain     Antimicrobials this admission:   Dose adjustments this admission:   Microbiology results:  Courtney SauersLisa Willia Norton Pharm.D. CPP, BCPS Clinical Pharmacist [phone] 03/29/2018 10:43 AM

## 2018-03-29 NOTE — H&P (Addendum)
PULMONARY / CRITICAL CARE MEDICINE   Name: Elexis Pollak MRN: [id number] DOB: 1956-09-25    ADMISSION DATE:  03/27/2018    REFERRING MD:  None  CHIEF COMPLAINT: Cardiac Arrest  HISTORY OF PRESENT ILLNESS:   The patient  Was found by her significant other frothing at the mouth after he had returned to the hotel rooom after stepping out for a few minutes from what I have been informed. The patient was unarousable and did not appear to be breathing or have a pulse. CPR was started and an ambulance was summoned.  The patient atone time time had coarse v fib and was shocked. Her intial rhythm was a wide complex bradycardia after ROSC. The patient was found to have a profound acidosis. She required bicarb pushes and was placed on a bicarb drip. The patient had a CVP placed in the right groin. She is on Levophed 20 mcg/min and epinephrine 9 mcg/min.  She has elevated LFTs likely due to shock liver. CT of the head was unremarkable. He r intiual trop was 0.06.  The patient apparently had been admitted to the hospital 1 month ago fgor anemia and atrial fibrillation.   PAST MEDICAL HISTORY :  She  has no past medical history on file.  PAST SURGICAL HISTORY: She  has no past surgical history on file.  Allergies  Allergen Reactions  . Cefaclor Hives  . Nitrofurantoin Hives and Nausea Only  . Ciclonium   . Macrodantin  [Nitrofurantoin Macrocrystal] Nausea Only  . Doxycycline     Other reaction(s): Abdominal Pain Other reaction(s): Abdominal Pain Other reaction(s): Abdominal Pain     No current facility-administered medications on file prior to encounter.    Current Outpatient Medications on File Prior to Encounter  Medication Sig  . aspirin EC 81 MG tablet Take 81 mg by mouth daily.  . citalopram (CELEXA) 40 MG tablet Take 40 mg by mouth daily.  . diazepam (VALIUM) 2 MG tablet Take 2 mg by mouth every 8 (eight) hours as needed for anxiety.  Marland Kitchen diltiazem (CARDIZEM CD) 120  MG 24 hr capsule Take by mouth.  . gabapentin (NEURONTIN) 300 MG capsule Take 300 mg by mouth 3 (three) times daily.  Marland Kitchen omeprazole (PRILOSEC) 40 MG capsule Take 40 mg by mouth daily.  Marland Kitchen topiramate (TOPAMAX) 50 MG tablet Take 50 mg by mouth 2 (two) times daily.    FAMILY HISTORY:  Her has no family status information on file.  According to her significant other there is no close family.  SOCIAL HISTORY: Unknown REVIEW OF SYSTEMS:   Not available as the patient is comatose     VITAL SIGNS: BP 135/66 (BP Location: Left Arm)   Pulse 63   Temp (!) 91.6 F (33.1 C) (Core (Comment))   Resp (!) 24   Ht 5\' 8"  (1.727 m)   Wt 229 lb 4.5 oz (104 kg)   SpO2 97%   BMI 34.86 kg/m   HEMODYNAMICS: CVP:  [9 mmHg-15 mmHg] 15 mmHgPatient was in profound shock. When she arrived she was on 20 mcg levophed and 9 mcg/min of epinephrine.  VENTILATOR SETTINGS: Vent Mode: PRVC FiO2 (%):  [1 %-100 %] 50 % Set Rate:  [14 bmp-24 bmp] 24 bmp Vt Set:  [490 mL-560 mL] 490 mL PEEP:  [5 cmH20] 5 cmH20 Plateau Pressure:  [14 cmH20-23 cmH20] 21 cmH20  INTAKE / OUTPUT: I/O last 3 completed shifts: In: 3130.2 [I.V.:2900.2; NG/GT:30; IV Piggyback:200] Out: 115 [Urine:115]  PHYSICAL EXAMINATION: General: Patient  is an elderly female appropriate for her age lying in bed sedated Neuro: No gag or corneal reflex with a negative bilaterally dilated pupils Cardiovascular:  RRR s1S2 Lungs:  Bilateral; BS Abdomen:  Soft , BS, non-tender, no gross oreganomegaly Musculoskeletal:  Not moving, no gross atrophy Exttr; no cyanosis or edema, CVP in the righht groin Skin:  Dry and warm  LABS:  BMET Recent Labs  Lab 03/29/18 0147 03/29/18 0538 03/29/18 0747  NA 139 139 137  K 2.2* <2.0* <2.0*  CL 106 104 101  CO2 19* 18* 17*  BUN 34* 36* 38*  CREATININE 2.83* 2.90* 3.01*  GLUCOSE 410* 419* 443*    Electrolytes Recent Labs  Lab 03/29/18 0147 03/29/18 0538 03/29/18 0747  CALCIUM 7.3* 7.3* 7.3*   MG 1.7  --  1.7  PHOS  --  1.0*  --     CBC Recent Labs  Lab 03/25/2018 1330 03/30/2018 1808  04/10/2018 2056 03/30/2018 2236 03/29/18 0538  WBC 13.6* 15.4*  --   --   --  3.8*  HGB 7.4* 8.4*   < > 9.9* 9.5* 7.8*  HCT 27.8* 29.6*   < > 29.0* 28.0* 27.9*  PLT 405* 365  --   --   --  298   < > = values in this interval not displayed.    Coag's Recent Labs  Lab 04/18/2018 1808 03/29/18 0147 03/29/18 0747  APTT 41* 104*  --   INR 1.86 2.19 2.19    Sepsis Markers Recent Labs  Lab 04/14/2018 1547 04/06/2018 1816 03/29/18 1010  LATICACIDVEN 9.00* 9.3* 8.4*    ABG Recent Labs  Lab 03/27/2018 2220 03/29/18 0227 03/29/18 0547  PHART 7.121* 7.165* 7.262*  PCO2ART 51.1* 54.3* 45.4  PO2ART 64.4* 110* 88.5    Liver Enzymes Recent Labs  Lab 04/19/2018 1330 03/29/18 0747  AST 2,119* 3,608*  ALT 2,720* 2,814*  ALKPHOS 92 84  BILITOT 0.7 0.7  ALBUMIN 2.7* 2.3*    Cardiac Enzymes Recent Labs  Lab 04/10/2018 2213 03/29/18 0538 03/29/18 1204  TROPONINI 0.73* 1.10* 1.21*    Glucose Recent Labs  Lab 03/29/18 0625 03/29/18 0734 03/29/18 0843 03/29/18 0955 03/29/18 1055 03/29/18 1154  GLUCAP 415* 420* 387* 434* 407* 406*    Imaging Ct Head Wo Contrast  Result Date: 04/09/2018 CLINICAL DATA:  62 year old female found unconscious in a motel EXAM: CT HEAD WITHOUT CONTRAST TECHNIQUE: Contiguous axial images were obtained from the base of the skull through the vertex without intravenous contrast. COMPARISON:  None. FINDINGS: Brain: No evidence of acute infarction, hemorrhage, hydrocephalus, extra-axial collection or mass lesion/mass effect. Mild cerebral cortical atrophy, slightly advanced for age. Vascular: No hyperdense vessel or unexpected calcification. Skull: Normal. Negative for fracture or focal lesion. Sinuses/Orbits: No acute finding. Other: None. IMPRESSION: No acute intracranial abnormality. Mild cerebral cortical atrophy greater than expected for age. Electronically  Signed   By: Malachy Moan M.D.   On: 03/24/2018 14:09   Dg Chest Port 1 View  Result Date: 03/29/2018 CLINICAL DATA:  62 year old female. Ventilator dependence. Subsequent encounter. EXAM: PORTABLE CHEST 1 VIEW COMPARISON:  04/07/2018 chest x-ray. FINDINGS: Interval consolidation left lung without midline shift as would be expected with mucous plug. Given this consolidation as well as prominence of hilar structures bilaterally, it is possible this reflects pulmonary edema although atypical being greater on the left. Infectious process not excluded in proper clinical setting. Endotracheal tube tip 2.3 cm above the carina. Nasogastric tube courses below the diaphragm. Tip is not  included on the present exam. Slightly enlarged heart. Aorta incompletely assessed. IMPRESSION: Interval consolidation left lung without midline shift as would be expected with mucous plug. Given this consolidation as well as prominence of hilar structures bilaterally, it is possible this reflects pulmonary edema although atypical being greater on the left. Infectious process not excluded in proper clinical setting. Electronically Signed   By: Lacy Duverney M.D.   On: 03/29/2018 11:06   Dg Chest Portable 1 View  Result Date: 2018/04/06 CLINICAL DATA:  Respiratory failure, intubation. EXAM: PORTABLE CHEST 1 VIEW COMPARISON:  None. FINDINGS: Endotracheal tube tip 2.9 cm above the carina. Tubing with a side-port resembling a nasogastric tube extends further to the left distally than would be expected for the normal esophageal contour, about 5 cm to the left of midline. There is some indistinct retrocardiac airspace opacity. Aortopulmonary window is indistinct. Low lung volumes are present, causing crowding of the pulmonary vasculature. Mild prominence of the right upper paratracheal tissues. Right upper rib discontinuities are likely chronic laterally, and involve the second, third, fourth, and possibly the first ribs, with some  displacement. Upper normal heart size. IMPRESSION: 1. The endotracheal tube appears satisfactorily positioned. 2. Additional tubing which resembles a nasogastric tube extends 5 cm to the left of midline distally. This could be in a large hiatal hernia. I am skeptical that it is in the tracheobronchial tree. Position of this tube could be ascertained with chest CT if clinically warranted. 3. There is some retrocardiac density which could be from pneumonia, atelectasis, or a hiatal hernia. 4. Low lung volumes are present, causing crowding of the pulmonary vasculature. 5. Poor definition of the AP window, possibly due to low lung volumes but nonspecific. Similarly there is right paratracheal mediastinal prominence which could be from adenopathy but may be incidental. 6. Multiple chronic appearing displaced rib discontinuities in the upper right thorax laterally. Electronically Signed   By: Gaylyn Rong M.D.   On: 2018-04-06 13:55       LINES/TUBES:  oral ET Arterial line.  CVP OGT   ASSESSMENT / PLAN:  PULMONARY Ventilator dependent respiratory failure secondary to V. fib cardiac arrest Plan: Continue mechanical ventilator current settings. VAP precautions. Left lower lobe atelectasis seen on chest x-ray.  Continued IV antibiotics   CARDIOVASCULAR V. fib cardiac arrest Shock secondary to cardiac shock versus septic shock. Plan  Continue with pressors.  Vasopressin added.  Maintain map above 65. Follow-up echo. Continue the heparin drip. Cardiology consulted.  No acute indication for cardiac cath at this time.  EKG reviewed unremarkable at this time. Patient is undergoing hypothermia protocol at this time.  Rewarming at 6 PM on 03/29/2018.     RENAL Acute renal failure secondary to prerenal versus renal. Severe hypokalemia Hypophosphatemia Hypomagnesemia Anion gap metabolic acidosis secondary to lactic acidosis Plan: Replace electrolytes aggressively.  Potassium level goal above  3.5.  Patient given 2 g mag sulfate today. Mild improvement in the lactic acidosis.   Continue sodium bicarb 125 cc/h.   Monitor urine output.  GI Elevated LFTs secondary to ischemic hepatic injury Plan: Follow-up repeat LFTs. Protonix prophylaxis Follow-up lipase and amylase If patient more hemodynamically stable will need to consider CT abdomen pelvis to rule out intra-abdominal pathology for shock   HEMATOLOGIC Microcytic anemia  Plan: Follow-up repeat CBC.   No overt signs of bleeding at this time. Unsure patient has chronic anemia  Endocrine. Persistent hypoglycemia possibly secondary to acute inflammatory process.  Plan:  Patient on insulin drip.  Titrate insulin drip for blood sugar range below 200. Follow-up TSH, hemoglobin A1c, lipid panel  NEUROLOGIC Acute encephalopathy secondary to cardiac arrest versus seizure versus CVA Plan: CT head at this time is negative for any acute intracranial process. Spot EEG does not show acute seizure at this time.  Continue with Keppra IV Patient will need repeat EEG status post hypothermia protocol. Patient will need repeat CT head status post hypothermia  Infectious disease Rule out septic shock Plan: Continue with IV antibiotics at this time.  Follow-up blood cultures. Follow pro-calcitonin.      FAMILY  Had extensive discussion with family at bedside on 4/8. Her son and boyfriend?. They are fully aware of what is going on. On further questioning boyfriend states patient has been complaining of diarrhea for the past couple of days. Son states patient takes NSAIDS for knee pain and has been drinking more alcohol lately.  They also states that about 3 years ago patient was admitted to a hospital in [place] for something very similar. Patient was found unresponsive and intubated and found to be in renal failure. Family also vividly remember her K and Mag being very low at that time  Patient was unresponsive for few days and  slowly became more responsive as her labs improved. At that time they were never able to find out the cause of her renal failure and abnormal labs.  Family deny patient having any suicidal or homicidal thoughts or activity. +hx of depression. I have obtained consent from family for release of medical records from [facility] in [id number], [place] and faxed the paper over to medical records. Will try to see if we can get records from the facility in the AM.    Lionel Decemberijo Marizol Borror D.O [facility] Pager: [phone]

## 2018-03-29 NOTE — Progress Notes (Signed)
eLink Physician-Brief Progress Note Patient Name: Courtney GuthrieDeborah Caskey-Slamon DOB: 10/18/1956 MRN: [id number]   Date of Service  03/29/2018  HPI/Events of Note  K+ < 2.0 and Creatinine = 2.90.  eICU Interventions  Will order: 1. Replace K+. 2. BMP at 2 PM.     Intervention Category Major Interventions: Electrolyte abnormality - evaluation and management  Juliannah Ohmann Eugene 03/29/2018, 7:13 AM

## 2018-03-29 NOTE — Significant Event (Addendum)
CRITICAL VALUE ALERT  Critical Value:  6.5  Date & Time Notied: 03/29/2018, @ 1701  Provider Notified:1700, Dr. Maisie Fus  Orders Received/Actions taken: No new orders   Mindi Akerson

## 2018-03-29 NOTE — Progress Notes (Signed)
Initial Nutrition Assessment  DOCUMENTATION CODES:   Obesity unspecified  INTERVENTION:   If tube feeding warranted: Vital High Protein @ 55 ml/hr 30 ml Prostat once daily  Provides: 1420 kcals, 131 grams protein, 1109 ml free water. Meets 100% of calorie and protein needs.   NUTRITION DIAGNOSIS:   Inadequate oral intake related to inability to eat as evidenced by NPO status.  GOAL:   Provide needs based on ASPEN/SCCM guidelines  MONITOR:   Diet advancement, Vent status, Labs, Weight trends, TF tolerance, I & O's  REASON FOR ASSESSMENT:   Ventilator    ASSESSMENT:   Patient without PMH on file. Recently admitted to the hospital for anemia and atrial fibrillation.  Presents this admission s/p cardiac arrest with acute respiratory failure.    No family at bedside to obtain nutrition history. Limited weight history on file. Pt on pressor support at this time. Remains on TTM.   Patient is currently intubated on ventilator support MV: 11.9 L/min Temp (24hrs), Avg:91.7 F (33.2 C), Min:90.3 F (32.4 C), Max:93.6 F (34.2 C) BP: 119/59 MAP: 76 Propofol: None  I/O: +4098 ml since admit UOP: 115 ml yesterday  Medications reviewed and include: Nimbex, epinephrine, Fentanyl, Mg sulfate, versed, levophed, KPO4, sodium bicarbonate, vasopressin Labs reviewed: K <2.0 (L) CBG 443 (H) Creatinine 3.01 (H) AST 3608 (H) ALT 2814 (H) Phosphorus 1.0 (L)  NUTRITION - FOCUSED PHYSICAL EXAM:    Most Recent Value  Orbital Region  No depletion  Upper Arm Region  No depletion  Thoracic and Lumbar Region  Unable to assess  Buccal Region  Unable to assess  Temple Region  No depletion  Clavicle Bone Region  No depletion  Clavicle and Acromion Bone Region  No depletion  Scapular Bone Region  Unable to assess  Dorsal Hand  No depletion  Patellar Region  No depletion  Anterior Thigh Region  No depletion  Posterior Calf Region  No depletion  Edema (RD Assessment)  Mild     Diet  Order:  Diet NPO time specified  EDUCATION NEEDS:   Not appropriate for education at this time  Skin:  Skin Assessment: Reviewed RN Assessment  Last BM:  4/7  Height:   Ht Readings from Last 1 Encounters:  01-10-18 5\' 8"  (1.727 m)    Weight:   Wt Readings from Last 1 Encounters:  03/29/18 229 lb 4.5 oz (104 kg)    Ideal Body Weight:  63.6 kg  BMI:  Body mass index is 34.86 kg/m.  Estimated Nutritional Needs:   Kcal:  1144-1456 kcal  Protein:  127-137 g  Fluid:  >1.1 L/day    Vanessa Kickarly Jacolyn Joaquin RD, LDN Clinical Nutrition Pager # [phone]

## 2018-03-29 NOTE — Progress Notes (Signed)
VASCULAR LAB PRELIMINARY  PRELIMINARY  PRELIMINARY  PRELIMINARY  Bilateral lower extremity venous duplex completed.    Preliminary report:  There is no obvious evidence of DVT or SVT noted in the visualized veins of the bilateral lower extremities.   Courtney Norton, RVT 03/29/2018, 12:33 PM

## 2018-03-29 NOTE — Progress Notes (Signed)
eLink Physician-Brief Progress Note Patient Name: Durenda GuthrieDeborah Caskey-Slamon DOB: 02/07/1956 MRN: [id number]   Date of Service  03/29/2018  HPI/Events of Note  Mg++ = 1.7 and Creatinine = 2.83.  eICU Interventions  Will cautiously replace Mg++.      Intervention Category Major Interventions: Electrolyte abnormality - evaluation and management  Sommer,Steven Eugene 03/29/2018, 5:36 AM

## 2018-03-29 NOTE — Procedures (Signed)
ELECTROENCEPHALOGRAM REPORT  Date of Study: 03/29/2018  Patient's Name: Courtney GuthrieDeborah Norton MRN: [id number] Date of Birth: 1956-04-09  Referring Provider: Jamesetta Soobert Rosenblatt, MD  Clinical History: 62 year old female status post cardiac arrest.  Medications: Versed Fentanyl Nibex  Technical Summary: A multichannel digital EEG recording measured by the international 10-20 system with electrodes applied with paste and impedances below 5000 ohms performed in our laboratory with EKG monitoring in an intubated and sedated patient.  Hyperventilation and photic stimulation were not performed.  The digital EEG was referentially recorded, reformatted, and digitally filtered in a variety of bipolar and referential montages for optimal display.    Description: The patient is intubated and unresponsive, under sedation with versed and fentanyl during the recording. There is loss of normal background activity. The record shows diffuse suppression of background activity. There is no spontaneous reactivity or reactivity noted with noxious stimulation. Hyperventilation and photic stimulation were not performed. There were no epileptiform discharges or electrographic seizures seen.   EKG lead was unremarkable.  Impression: This EEG is markedly abnormal due to diffuse background suppression and lack of EEG reactivity with noxious stimulation.  Clinical Correlation of the above findings indicates severe diffuse cerebral dysfunction that is non-specific in etiology and can be seen in the setting of anoxic/ischemic injury, toxic/metabolic encephalopathies, or medication effect. Clinical correlation is advised.  Shon MilletAdam Cristalle Rohm, DO

## 2018-03-29 NOTE — Progress Notes (Signed)
EEG completed, results pending. 

## 2018-03-29 NOTE — Progress Notes (Signed)
eLink Physician-Brief Progress Note Patient Name: Durenda GuthrieDeborah Caskey-Slamon DOB: 08/24/1956 MRN: [id number]   Date of Service  03/29/2018  HPI/Events of Note  Bedside nurse reports cloudy urine and chronic Foley catheter from rehab facility.   eICU Interventions  Will order: 1. Change Foley catheter.  2. Urine culture now.      Intervention Category Major Interventions: Other:  Lenell AntuSommer,Jackee Glasner Eugene 03/29/2018, 10:11 PM

## 2018-03-29 NOTE — Significant Event (Signed)
Critical results called to nursing staff has been relay to Dr. Maisie Fus at bedside throughout today. No new orders regarding critical results of lactic acid. New orders for potassium replacements already received.   Courtney Norton

## 2018-03-30 DIAGNOSIS — N179 Acute kidney failure, unspecified: Secondary | ICD-10-CM

## 2018-03-30 DIAGNOSIS — K7200 Acute and subacute hepatic failure without coma: Secondary | ICD-10-CM

## 2018-03-30 DIAGNOSIS — J9601 Acute respiratory failure with hypoxia: Secondary | ICD-10-CM

## 2018-03-30 LAB — GLUCOSE, CAPILLARY
GLUCOSE-CAPILLARY: 166 mg/dL — AB (ref 65–99)
GLUCOSE-CAPILLARY: 129 mg/dL — AB (ref 65–99)
GLUCOSE-CAPILLARY: 130 mg/dL — AB (ref 65–99)
Glucose-Capillary: 225 mg/dL — ABNORMAL HIGH (ref 65–99)
Glucose-Capillary: 195 mg/dL — ABNORMAL HIGH (ref 65–99)
Glucose-Capillary: 172 mg/dL — ABNORMAL HIGH (ref 65–99)
Glucose-Capillary: 151 mg/dL — ABNORMAL HIGH (ref 65–99)
Glucose-Capillary: 121 mg/dL — ABNORMAL HIGH (ref 65–99)
Glucose-Capillary: 108 mg/dL — ABNORMAL HIGH (ref 65–99)
Glucose-Capillary: 114 mg/dL — ABNORMAL HIGH (ref 65–99)
Glucose-Capillary: 109 mg/dL — ABNORMAL HIGH (ref 65–99)
Glucose-Capillary: 117 mg/dL — ABNORMAL HIGH (ref 65–99)
Glucose-Capillary: 147 mg/dL — ABNORMAL HIGH (ref 65–99)
Glucose-Capillary: 161 mg/dL — ABNORMAL HIGH (ref 65–99)

## 2018-03-30 LAB — LIPID PANEL
CHOLESTEROL: 63 mg/dL (ref 0–200)
HDL: 27 mg/dL — ABNORMAL LOW (ref >40)
LDL Cholesterol: 29 mg/dL (ref 0–99)
TRIGLYCERIDES: 34 mg/dL (ref <150)
Total CHOL/HDL Ratio: 2.3 RATIO
VLDL: 7 mg/dL (ref 0–40)

## 2018-03-30 LAB — BASIC METABOLIC PANEL
Anion gap: 13 (ref 5–15)
Anion gap: 14 (ref 5–15)
BUN: 41 mg/dL — ABNORMAL HIGH (ref 6–20)
BUN: 42 mg/dL — AB (ref 6–20)
CHLORIDE: 100 mmol/L — AB (ref 101–111)
CHLORIDE: 98 mmol/L — AB (ref 101–111)
CO2: 24 mmol/L (ref 22–32)
CO2: 20 mmol/L — AB (ref 22–32)
CREATININE: 3.03 mg/dL — AB (ref 0.44–1.00)
CREATININE: 3.2 mg/dL — AB (ref 0.44–1.00)
Calcium: 6.3 mg/dL — CL (ref 8.9–10.3)
Calcium: 6.5 mg/dL — ABNORMAL LOW (ref 8.9–10.3)
GFR calc Af Amer: 17 mL/min — ABNORMAL LOW (ref >60)
GFR calc non Af Amer: 14 mL/min — ABNORMAL LOW (ref >60)
GFR calc non Af Amer: 15 mL/min — ABNORMAL LOW (ref >60)
GFR, EST AFRICAN AMERICAN: 18 mL/min — AB (ref >60)
Glucose, Bld: 115 mg/dL — ABNORMAL HIGH (ref 65–99)
Glucose, Bld: 221 mg/dL — ABNORMAL HIGH (ref 65–99)
POTASSIUM: 3.5 mmol/L (ref 3.5–5.1)
Potassium: 4.3 mmol/L (ref 3.5–5.1)
Sodium: 134 mmol/L — ABNORMAL LOW (ref 135–145)
Sodium: 135 mmol/L (ref 135–145)

## 2018-03-30 LAB — COMPREHENSIVE METABOLIC PANEL
ALT: 2127 U/L — ABNORMAL HIGH (ref 14–54)
AST: 2057 U/L — ABNORMAL HIGH (ref 15–41)
Albumin: 1.9 g/dL — ABNORMAL LOW (ref 3.5–5.0)
Alkaline Phosphatase: 82 U/L (ref 38–126)
Anion gap: 15 (ref 5–15)
BUN: 42 mg/dL — ABNORMAL HIGH (ref 6–20)
CO2: 22 mmol/L (ref 22–32)
Calcium: 6.6 mg/dL — ABNORMAL LOW (ref 8.9–10.3)
Chloride: 97 mmol/L — ABNORMAL LOW (ref 101–111)
Creatinine, Ser: 3.09 mg/dL — ABNORMAL HIGH (ref 0.44–1.00)
GFR calc Af Amer: 17 mL/min — ABNORMAL LOW (ref >60)
GFR calc non Af Amer: 15 mL/min — ABNORMAL LOW (ref >60)
Glucose, Bld: 145 mg/dL — ABNORMAL HIGH (ref 65–99)
Potassium: 3.7 mmol/L (ref 3.5–5.1)
Sodium: 134 mmol/L — ABNORMAL LOW (ref 135–145)
Total Bilirubin: 0.6 mg/dL (ref 0.3–1.2)
Total Protein: 4.4 g/dL — ABNORMAL LOW (ref 6.5–8.1)

## 2018-03-30 LAB — LACTIC ACID, PLASMA
Lactic Acid, Venous: 2.7 mmol/L (ref 0.5–1.9)
Lactic Acid, Venous: 3.3 mmol/L (ref 0.5–1.9)
Lactic Acid, Venous: 4.5 mmol/L (ref 0.5–1.9)

## 2018-03-30 LAB — CBC
HEMATOCRIT: 23.4 % — AB (ref 36.0–46.0)
HEMOGLOBIN: 7 g/dL — AB (ref 12.0–15.0)
MCH: 18.3 pg — ABNORMAL LOW (ref 26.0–34.0)
MCHC: 29.9 g/dL — AB (ref 30.0–36.0)
MCV: 61.1 fL — ABNORMAL LOW (ref 78.0–100.0)
Platelets: 159 10*3/uL (ref 150–400)
RBC: 3.83 MIL/uL — AB (ref 3.87–5.11)
RDW: 27.6 % — ABNORMAL HIGH (ref 11.5–15.5)
WBC: 9.2 10*3/uL (ref 4.0–10.5)

## 2018-03-30 LAB — MAGNESIUM
Magnesium: 1.5 mg/dL — ABNORMAL LOW (ref 1.7–2.4)
Magnesium: 1.6 mg/dL — ABNORMAL LOW (ref 1.7–2.4)
Magnesium: 1.6 mg/dL — ABNORMAL LOW (ref 1.7–2.4)

## 2018-03-30 LAB — TSH: TSH: 3.583 u[IU]/mL (ref 0.350–4.500)

## 2018-03-30 LAB — HEMOGLOBIN A1C
Hgb A1c MFr Bld: 5.6 % (ref 4.8–5.6)
Mean Plasma Glucose: 114.02 mg/dL

## 2018-03-30 LAB — PROCALCITONIN: Procalcitonin: 72.07 ng/mL

## 2018-03-30 LAB — VANCOMYCIN, RANDOM: Vancomycin Rm: 16

## 2018-03-30 LAB — PHOSPHORUS
Phosphorus: 5.6 mg/dL — ABNORMAL HIGH (ref 2.5–4.6)
Phosphorus: 6 mg/dL — ABNORMAL HIGH (ref 2.5–4.6)
Phosphorus: 6.2 mg/dL — ABNORMAL HIGH (ref 2.5–4.6)

## 2018-03-30 LAB — BRAIN NATRIURETIC PEPTIDE: B Natriuretic Peptide: 2222.8 pg/mL — ABNORMAL HIGH (ref 0.0–100.0)

## 2018-03-30 LAB — HEPARIN LEVEL (UNFRACTIONATED)
HEPARIN UNFRACTIONATED: 0.53 [IU]/mL (ref 0.30–0.70)
Heparin Unfractionated: 0.53 IU/mL (ref 0.30–0.70)

## 2018-03-30 MED ORDER — ORAL CARE MOUTH RINSE
15.0000 mL | OROMUCOSAL | Status: DC
  Administered 2018-03-30 – 2018-04-03 (×36): 15 mL via OROMUCOSAL

## 2018-03-30 MED ORDER — MAGNESIUM SULFATE 2 GM/50ML IV SOLN
2.0000 g | Freq: Once | INTRAVENOUS | Status: AC
  Administered 2018-03-30: 2 g via INTRAVENOUS
  Filled 2018-03-30: qty 50

## 2018-03-30 MED ORDER — SODIUM CHLORIDE 0.9 % IV SOLN
2.0000 g | Freq: Once | INTRAVENOUS | Status: AC
  Administered 2018-03-30: 2 g via INTRAVENOUS
  Filled 2018-03-30: qty 20

## 2018-03-30 MED ORDER — INSULIN ASPART 100 UNIT/ML ~~LOC~~ SOLN
0.0000 [IU] | SUBCUTANEOUS | Status: DC
  Administered 2018-03-30: 1 [IU] via SUBCUTANEOUS
  Administered 2018-03-30 (×2): 2 [IU] via SUBCUTANEOUS
  Administered 2018-03-31 (×3): 1 [IU] via SUBCUTANEOUS
  Administered 2018-03-31: 2 [IU] via SUBCUTANEOUS

## 2018-03-30 MED ORDER — CHLORHEXIDINE GLUCONATE 0.12% ORAL RINSE (MEDLINE KIT)
15.0000 mL | Freq: Two times a day (BID) | OROMUCOSAL | Status: DC
  Administered 2018-03-30 – 2018-04-03 (×8): 15 mL via OROMUCOSAL

## 2018-03-30 NOTE — Care Management Note (Signed)
Case Management Note Donn PieriniKristi Dyllin Gulley RN, BSN Unit 4E-Case Manager-- 2H coverage [phone]  Patient Details  Name: Courtney GuthrieDeborah Norton MRN: [id number] Date of Birth: 07/19/1956  Subjective/Objective:   Pt admitted s/p cardiac arrest- remains on vent and multiple pressors               Action/Plan: PTA Pt lived at motel with significant other- independent-  CM to follow for transition of care needs.   Expected Discharge Date:                  Expected Discharge Plan:     In-House Referral:     Discharge planning Services  CM Consult  Post Acute Care Choice:    Choice offered to:     DME Arranged:    DME Agency:     HH Arranged:    HH Agency:     Status of Service:  In process, will continue to follow  If discussed at Long Length of Stay Meetings, dates discussed:    Discharge Disposition:   Additional Comments:  Darrold SpanWebster, Pavielle Biggar Hall, RN 03/30/2018, 2:21 PM

## 2018-03-30 NOTE — Progress Notes (Signed)
Pharmacy Antibiotic Note  Courtney GuthrieDeborah Norton is a 62 y.o. female admitted on 03/29/2018 s/p cardiac arrest now intubated and sedated and TTm at 33deg.  WBC elevated on admit and possible pna started on aztreonam and clindamycin.  Will add vancomycin given acute illness.  Cr rising up 3 post arrest.  Due to rising Scr, post load 24 hour level drawn pre steady state was 16 mcg/ml. Will resume q24h dosing for now and monitor renal function and for drug accumulation.    Plan: Continue Vancomycin 1gm q24h Aztreonam 1gm q8h Clindamycin 600mg  q8h  Height: 5\' 8"  (172.7 cm) Weight: 229 lb 4.5 oz (104 kg) IBW/kg (Calculated) : 63.9  Temp (24hrs), Avg:94.8 F (34.9 C), Min:91.2 F (32.9 C), Max:98.8 F (37.1 C)  Recent Labs  Lab 12/26/17 1330  12/26/17 1808  03/29/18 0538  03/29/18 1618 03/29/18 1619 03/29/18 1955 03/29/18 2339 03/30/18 0400 03/30/18 0809 03/30/18 0810 03/30/18 1100  WBC 13.6*  --  15.4*  --  3.8*  --   --   --   --   --  9.2  --   --   --   CREATININE 2.61*  --  2.74*   < > 2.90*   < >  --  3.02* 3.01* 3.03* 3.09* 3.20*  --   --   LATICACIDVEN  --    < >  --    < >  --    < > 6.5*  --  5.7* 4.5* 3.3*  --  2.7*  --   VANCORANDOM  --   --   --   --   --   --   --   --   --   --   --   --   --  16   < > = values in this interval not displayed.    Estimated Creatinine Clearance: 22.7 mL/min (A) (by C-G formula based on SCr of 3.2 mg/dL (H)).    Allergies  Allergen Reactions  . Cefaclor Hives  . Nitrofurantoin Hives and Nausea Only  . Ciclonium   . Macrodantin  [Nitrofurantoin Macrocrystal] Nausea Only  . Doxycycline     Other reaction(s): Abdominal Pain Other reaction(s): Abdominal Pain Other reaction(s): Abdominal Pain    Dose adjustments this admission:   Microbiology results:  [name]. [name], PharmD, BCPS Clinical Pharmacist [place] Pager: [phone]  03/30/2018 12:37 PM

## 2018-03-30 NOTE — Progress Notes (Signed)
Patient's husband Gerlene BurdockRichard and son Caryn Bee(Kevin) at bedside. Update given on patients current status and plan of care. While updating immediate family patient's boyfriend arrived and visited with patient and family. The boyfriend Jonny Ruiz(John) gave us the correct fax number for [facility] [place] to obtain the patients previous medical record where she experienced a cardiac arrest 3 years ago. The consent for release of records was signed via patient's son and placed in chart.    Family requested MD update: MD Aljishi arrived at bedside and gave update on patients condition.   I discussed with her husband and son on the importance of creating a password as they are the next legal kin. I explained that it is their decision to whom can have this password in which information can be given and obtained. At this time they were undecided on if they wanted to create a password. I told them to think about their decision and let us know before they leave.  Willeen Nieceebecca Lonisha Bobby, RN

## 2018-03-30 NOTE — Progress Notes (Addendum)
PULMONARY / CRITICAL CARE MEDICINE   Name: Courtney Norton MRN: [id number] DOB: 1956/07/22    ADMISSION DATE:  04/06/2018    REFERRING MD:  None  Interval Hx: Patient was rewarmed. Still on multiple pressors levophed, epi and vasopressin. Still not responsive with dilated pupils.   CHIEF COMPLAINT: Cardiac Arrest  HISTORY OF PRESENT ILLNESS:   The patient  Was found by her significant other frothing at the mouth after he had returned to the hotel rooom after stepping out for a few minutes from what I have been informed. The patient was unarousable and did not appear to be breathing or have a pulse. CPR was started and an ambulance was summoned.  The patient atone time time had coarse v fib and was shocked. Her intial rhythm was a wide complex bradycardia after ROSC. The patient was found to have a profound acidosis. She required bicarb pushes and was placed on a bicarb drip. The patient had a CVP placed in the right groin. She is on Levophed 20 mcg/min and epinephrine 9 mcg/min.  She has elevated LFTs likely due to shock liver. CT of the head was unremarkable. He r intiual trop was 0.06.  The patient apparently had been admitted to the hospital 1 month ago fgor anemia and atrial fibrillation.   PAST MEDICAL HISTORY :  She  has no past medical history on file.  PAST SURGICAL HISTORY: She  has no past surgical history on file.  Allergies  Allergen Reactions  . Cefaclor Hives  . Nitrofurantoin Hives and Nausea Only  . Ciclonium   . Macrodantin  [Nitrofurantoin Macrocrystal] Nausea Only  . Doxycycline     Other reaction(s): Abdominal Pain Other reaction(s): Abdominal Pain Other reaction(s): Abdominal Pain     No current facility-administered medications on file prior to encounter.    Current Outpatient Medications on File Prior to Encounter  Medication Sig  . aspirin EC 81 MG tablet Take 81 mg by mouth daily.  . citalopram (CELEXA) 40 MG tablet Take 40 mg by  mouth daily.  . diazepam (VALIUM) 2 MG tablet Take 2 mg by mouth every 8 (eight) hours as needed for anxiety.  Marland Kitchen diltiazem (CARDIZEM CD) 120 MG 24 hr capsule Take by mouth.  . gabapentin (NEURONTIN) 300 MG capsule Take 300 mg by mouth 3 (three) times daily.  Marland Kitchen omeprazole (PRILOSEC) 40 MG capsule Take 40 mg by mouth daily.  Marland Kitchen topiramate (TOPAMAX) 50 MG tablet Take 50 mg by mouth 2 (two) times daily.    FAMILY HISTORY:  Her has no family status information on file.  According to her significant other there is no close family.  SOCIAL HISTORY: Unknown REVIEW OF SYSTEMS:   Not available as the patient is comatose     VITAL SIGNS: BP (!) 144/59 (BP Location: Left Arm)   Pulse 86   Temp 98.8 F (37.1 C) (Bladder)   Resp (!) 0   Ht 5\' 8"  (1.727 m)   Wt 104 kg (229 lb 4.5 oz)   SpO2 93%   BMI 34.86 kg/m   HEMODYNAMICS: CVP:  [11 mmHg-16 mmHg] 12 mmHgPatient was in profound shock. When she arrived she was on 20 mcg levophed and 9 mcg/min of epinephrine.  VENTILATOR SETTINGS: Vent Mode: PRVC FiO2 (%):  [40 %-50 %] 40 % Set Rate:  [24 bmp] 24 bmp Vt Set:  [490 mL] 490 mL PEEP:  [5 cmH20] 5 cmH20 Plateau Pressure:  [21 cmH20-26 cmH20] 25 cmH20  INTAKE / OUTPUT: I/O  last 3 completed shifts: In: 11220.9 [I.V.:8861.8; IV Piggyback:2359.1] Out: 265 [Urine:255; Emesis/NG output:10]  PHYSICAL EXAMINATION: General: Patient is an elderly female appropriate for her age lying in bed sedated Neuro: No gag or corneal reflex with a negative bilaterally dilated pupils Cardiovascular:  RRR s1S2 Lungs:  Bilateral; BS Abdomen:  Soft , BS, non-tender, no gross oreganomegaly Musculoskeletal:  Not moving, no gross atrophy Exttr; no cyanosis or edema, CVP in the righht groin Skin:  Dry and warm  LABS:  BMET Recent Labs  Lab 03/29/18 1955 03/29/18 2339 03/30/18 0400  NA 133* 134* 134*  K 3.6 3.5 3.7  CL 101 100* 97*  CO2 19* 20* 22  BUN 40* 41* 42*  CREATININE 3.01* 3.03* 3.09*   GLUCOSE 352* 221* 145*    Electrolytes Recent Labs  Lab 03/29/18 1955 03/29/18 2339 03/30/18 0400  CALCIUM 6.8* 6.5* 6.6*  MG 1.7 1.6* 1.6*  PHOS 4.6 5.6* 6.0*    CBC Recent Labs  Lab 03/29/2018 1808  03/30/2018 2236 03/29/18 0538 03/30/18 0400  WBC 15.4*  --   --  3.8* 9.2  HGB 8.4*   < > 9.5* 7.8* 7.0*  HCT 29.6*   < > 28.0* 27.9* 23.4*  PLT 365  --   --  298 159   < > = values in this interval not displayed.    Coag's Recent Labs  Lab 03/22/2018 1808 03/29/18 0147 03/29/18 0747  APTT 41* 104*  --   INR 1.86 2.19 2.19    Sepsis Markers Recent Labs  Lab 03/29/18 1205  03/29/18 1955 03/29/18 2339 03/30/18 0400  LATICACIDVEN  --    < > 5.7* 4.5* 3.3*  PROCALCITON 28.93  --   --   --  72.07   < > = values in this interval not displayed.    ABG Recent Labs  Lab 04/08/2018 2220 03/29/18 0227 03/29/18 0547  PHART 7.121* 7.165* 7.262*  PCO2ART 51.1* 54.3* 45.4  PO2ART 64.4* 110* 88.5    Liver Enzymes Recent Labs  Lab 03/29/18 0747 03/29/18 1619 03/30/18 0400  AST 3,608* 3,117* 2,057*  ALT 2,814* 2,306* 2,127*  ALKPHOS 84 79 82  BILITOT 0.7 0.7 0.6  ALBUMIN 2.3* 2.0* 1.9*    Cardiac Enzymes Recent Labs  Lab 03/29/2018 2213 03/29/18 0538 03/29/18 1204  TROPONINI 0.73* 1.10* 1.21*    Glucose Recent Labs  Lab 03/30/18 0153 03/30/18 0256 03/30/18 0354 03/30/18 0454 03/30/18 0557 03/30/18 0653  GLUCAP 172* 166* 151* 129* 130* 121*    Imaging Dg Chest Port 1 View  Result Date: 03/29/2018 CLINICAL DATA:  62 year old female. Ventilator dependence. Subsequent encounter. EXAM: PORTABLE CHEST 1 VIEW COMPARISON:  04/11/2018 chest x-ray. FINDINGS: Interval consolidation left lung without midline shift as would be expected with mucous plug. Given this consolidation as well as prominence of hilar structures bilaterally, it is possible this reflects pulmonary edema although atypical being greater on the left. Infectious process not excluded in proper  clinical setting. Endotracheal tube tip 2.3 cm above the carina. Nasogastric tube courses below the diaphragm. Tip is not included on the present exam. Slightly enlarged heart. Aorta incompletely assessed. IMPRESSION: Interval consolidation left lung without midline shift as would be expected with mucous plug. Given this consolidation as well as prominence of hilar structures bilaterally, it is possible this reflects pulmonary edema although atypical being greater on the left. Infectious process not excluded in proper clinical setting. Electronically Signed   By: Lacy Duverney M.D.   On: 03/29/2018 11:06  LINES/TUBES:  oral ET Arterial line.  CVP OGT   ASSESSMENT / PLAN:  PULMONARY Acute hypoxemic respiratory failure Ventilator dependent respiratory failure secondary to V. fib cardiac arrest Aspiration PNA   Plan: Continue mechanical ventilator current settings. VAP precautions. ABx ?asp PNA  Left lower lobe atelectasis seen on chest x-ray.  Continued IV antibiotics   CARDIOVASCULAR V. fib cardiac arrest Shock secondary to cardiac shock versus septic shock. Plan  Titrate vasopressors. Patient is on vasopressin, epi and levophed drips keep map above 65. Follow-up echo showed normal EF. Continue the heparin drip. Cardiology consulted.  No acute indication for cardiac cath at this time.  EKG reviewed unremarkable at this time.     RENAL Acute renal failure secondary to prerenal versus renal. Severe hypokalemia Hypophosphatemia Hypomagnesemia Anion gap metabolic acidosis secondary to lactic acidosis Plan: Replace electrolytes aggressively.  Potassium level goal above 3.5.  Patient given 2 g mag sulfate today. Mild improvement in the lactic acidosis.   Continue sodium bicarb 125 cc/h.   Monitor urine output.  GI Elevated LFTs secondary to ischemic hepatic injury Shocked liver  Plan: Trending down , Follow-up repeat LFTs. Protonix prophylaxis Not stable to move for  CT abdomen   HEMATOLOGIC Microcytic anemia  Plan: Follow-up repeat CBC.   No overt signs of bleeding at this time. Unsure patient has chronic anemia  Endocrine. Persistent hyperglycemia possibly secondary to acute inflammatory process.  Plan:  Patient on insulin drip.  Titrate insulin drip for blood sugar range below 200. Follow-up TSH, hemoglobin A1c, lipid panel  NEUROLOGIC Acute encephalopathy secondary to cardiac arrest versus seizure versus CVA van not rule out anoxic brain injury Plan: CT head at this time is negative for any acute intracranial process. Spot EEG does not show acute seizure at this time.  Continue with Keppra IV Patient will need repeat EEG status post hypothermia protocol. Patient will need repeat CT head status post hypothermia  Infectious disease Rule out septic shock Plan: Continue with IV antibiotics at this time.  Follow-up blood cultures. Follow pro-calcitonin.      I have spent 32 mins of CC time bedside or in the unit exclusive of billable proecdures

## 2018-03-30 NOTE — Progress Notes (Addendum)
ANTICOAGULATION CONSULT NOTE - Follow Up Consult  Pharmacy Consult for Heparin Indication: chest pain/ACS  Allergies  Allergen Reactions  . Cefaclor Hives  . Nitrofurantoin Hives and Nausea Only  . Ciclonium   . Macrodantin  [Nitrofurantoin Macrocrystal] Nausea Only  . Doxycycline     Other reaction(s): Abdominal Pain Other reaction(s): Abdominal Pain Other reaction(s): Abdominal Pain     Patient Measurements: Height: 5\' 8"  (172.7 cm) Weight: 229 lb 4.5 oz (104 kg) IBW/kg (Calculated) : 63.9 Heparin Dosing Weight: 83 kg  Vital Signs: Temp: 98.8 F (37.1 C) (04/09 0800) Temp Source: Bladder (04/09 0800) BP: 144/59 (04/09 0800) Pulse Rate: 86 (04/09 0800)  Labs: Recent Labs    03/25/2018 1808  04/15/2018 2213 03/23/2018 2236 03/29/18 0147 03/29/18 0538 03/29/18 0747 03/29/18 1204  03/29/18 1955 03/29/18 2339 03/30/18 0344 03/30/18 0400  HGB 8.4*   < >  --  9.5*  --  7.8*  --   --   --   --   --   --  7.0*  HCT 29.6*   < >  --  28.0*  --  27.9*  --   --   --   --   --   --  23.4*  PLT 365  --   --   --   --  298  --   --   --   --   --   --  159  APTT 41*  --   --   --  104*  --   --   --   --   --   --   --   --   LABPROT 21.2*  --   --   --  24.2*  --  24.2*  --   --   --   --   --   --   INR 1.86  --   --   --  2.19  --  2.19  --   --   --   --   --   --   HEPARINUNFRC  --   --   --   --   --  0.24* 0.31  --   --   --   --  0.53  --   CREATININE 2.74*   < > 2.83*  --  2.83* 2.90* 3.01*  --    < > 3.01* 3.03*  --  3.09*  TROPONINI 0.40*  --  0.73*  --   --  1.10*  --  1.21*  --   --   --   --   --    < > = values in this interval not displayed.    Estimated Creatinine Clearance: 23.5 mL/min (A) (by C-G formula based on SCr of 3.09 mg/dL (H)).   Medical History: No past medical history on file.  Assessment: [age] YOF who presented on 4/7 s/p cardiac arrest. Pharmacy consulted to start Heparin for ACS.   The patient was not noted to be on anticoagulation PTA. Hg  low at BL watch no active bleeding plts wnl. Head CT showed no acute bleed or CVA. Noted s/p TTM 33 deg LFTs elevated post arrest 3000. INR elevted at 2 post arrest Cr rising 3  Heparin drip 1100 uts/hr, HL 0.53 at goal, Repeat HL 0.53 as well PLTC dropped to 159, low clinical suspicion for HIT, but will get HIT antibody.  Goal of Therapy:  Heparin level 0.3-0.7 units/ml Monitor platelets by anticoagulation protocol: Yes  Plan:  Continue heparin drip 1100 uts/hr  Daily HL CBC HIT antibody Monitor s/s bleeding     Kerstin Crusoe A. Jeanella Craze, PharmD, BCPS Clinical Pharmacist [place] Pager: [phone]  03/30/2018 8:26 AM

## 2018-03-30 NOTE — Progress Notes (Addendum)
CRITICAL VALUE ALERT  Critical Value: Lactic Acid 4.5  Date & Time Notied: 03/30/18 0054  Provider Notified: Pola CornElink  Orders Received/Actions taken: No new orders at this time

## 2018-03-31 ENCOUNTER — Inpatient Hospital Stay (HOSPITAL_COMMUNITY): Payer: Self-pay

## 2018-03-31 DIAGNOSIS — G9341 Metabolic encephalopathy: Secondary | ICD-10-CM

## 2018-03-31 LAB — BASIC METABOLIC PANEL
ANION GAP: 17 — AB (ref 5–15)
ANION GAP: 13 (ref 5–15)
BUN: 51 mg/dL — ABNORMAL HIGH (ref 6–20)
BUN: 55 mg/dL — AB (ref 6–20)
CALCIUM: 6 mg/dL — AB (ref 8.9–10.3)
CO2: 26 mmol/L (ref 22–32)
CO2: 26 mmol/L (ref 22–32)
Calcium: 6 mg/dL — CL (ref 8.9–10.3)
Chloride: 84 mmol/L — ABNORMAL LOW (ref 101–111)
Chloride: 84 mmol/L — ABNORMAL LOW (ref 101–111)
Creatinine, Ser: 3.94 mg/dL — ABNORMAL HIGH (ref 0.44–1.00)
Creatinine, Ser: 4.13 mg/dL — ABNORMAL HIGH (ref 0.44–1.00)
GFR calc Af Amer: 12 mL/min — ABNORMAL LOW (ref >60)
GFR calc non Af Amer: 11 mL/min — ABNORMAL LOW (ref >60)
GFR calc non Af Amer: 11 mL/min — ABNORMAL LOW (ref >60)
GFR, EST AFRICAN AMERICAN: 13 mL/min — AB (ref >60)
GLUCOSE: 124 mg/dL — AB (ref 65–99)
Glucose, Bld: 146 mg/dL — ABNORMAL HIGH (ref 65–99)
POTASSIUM: 5.9 mmol/L — AB (ref 3.5–5.1)
Potassium: 6 mmol/L — ABNORMAL HIGH (ref 3.5–5.1)
SODIUM: 127 mmol/L — AB (ref 135–145)
Sodium: 123 mmol/L — ABNORMAL LOW (ref 135–145)

## 2018-03-31 LAB — CBC WITH DIFFERENTIAL/PLATELET
BASOS PCT: 0 %
Eosinophils Relative: 1 %
HEMATOCRIT: 20.8 % — AB (ref 36.0–46.0)
HEMOGLOBIN: 6.3 g/dL — AB (ref 12.0–15.0)
LYMPHS PCT: 8 %
MCH: 18.2 pg — ABNORMAL LOW (ref 26.0–34.0)
MCHC: 30.3 g/dL (ref 30.0–36.0)
MCV: 60.1 fL — ABNORMAL LOW (ref 78.0–100.0)
Monocytes Relative: 2 %
Neutrophils Relative %: 89 %
Platelets: 101 10*3/uL — ABNORMAL LOW (ref 150–400)
RBC: 3.46 MIL/uL — ABNORMAL LOW (ref 3.87–5.11)
RDW: 28.5 % — AB (ref 11.5–15.5)
WBC MORPHOLOGY: INCREASED
WBC: 20.6 10*3/uL — ABNORMAL HIGH (ref 4.0–10.5)

## 2018-03-31 LAB — POCT I-STAT 3, ART BLOOD GAS (G3+)
ACID-BASE EXCESS: 2 mmol/L (ref 0.0–2.0)
BICARBONATE: 27.4 mmol/L (ref 20.0–28.0)
O2 Saturation: 89 %
Patient temperature: 37
TCO2: 29 mmol/L (ref 22–32)
pCO2 arterial: 47.5 mmHg (ref 32.0–48.0)
pH, Arterial: 7.369 (ref 7.350–7.450)
pO2, Arterial: 59 mmHg — ABNORMAL LOW (ref 83.0–108.0)

## 2018-03-31 LAB — HEPARIN LEVEL (UNFRACTIONATED): Heparin Unfractionated: 0.38 IU/mL (ref 0.30–0.70)

## 2018-03-31 LAB — URINE CULTURE
Culture: NO GROWTH
Special Requests: NORMAL

## 2018-03-31 LAB — GLUCOSE, CAPILLARY
GLUCOSE-CAPILLARY: 156 mg/dL — AB (ref 65–99)
GLUCOSE-CAPILLARY: 147 mg/dL — AB (ref 65–99)
GLUCOSE-CAPILLARY: 144 mg/dL — AB (ref 65–99)
Glucose-Capillary: 157 mg/dL — ABNORMAL HIGH (ref 65–99)
Glucose-Capillary: 124 mg/dL — ABNORMAL HIGH (ref 65–99)

## 2018-03-31 LAB — HEPATIC FUNCTION PANEL
ALBUMIN: 1.7 g/dL — AB (ref 3.5–5.0)
ALT: 1631 U/L — ABNORMAL HIGH (ref 14–54)
AST: 986 U/L — AB (ref 15–41)
Alkaline Phosphatase: 129 U/L — ABNORMAL HIGH (ref 38–126)
Bilirubin, Direct: 0.3 mg/dL (ref 0.1–0.5)
Indirect Bilirubin: 0.5 mg/dL (ref 0.3–0.9)
TOTAL PROTEIN: 4.2 g/dL — AB (ref 6.5–8.1)
Total Bilirubin: 0.8 mg/dL (ref 0.3–1.2)

## 2018-03-31 LAB — MAGNESIUM: Magnesium: 1.8 mg/dL (ref 1.7–2.4)

## 2018-03-31 LAB — PROCALCITONIN: Procalcitonin: 56.54 ng/mL

## 2018-03-31 LAB — VANCOMYCIN, TROUGH: VANCOMYCIN TR: 21 ug/mL — AB (ref 15–20)

## 2018-03-31 LAB — ABO/RH: ABO/RH(D): A POS

## 2018-03-31 LAB — HEPARIN INDUCED PLATELET AB (HIT ANTIBODY): HEPARIN INDUCED PLT AB: 0.388 {OD_unit} (ref 0.000–0.400)

## 2018-03-31 LAB — PREPARE RBC (CROSSMATCH)

## 2018-03-31 MED ORDER — INSULIN ASPART 100 UNIT/ML IV SOLN
10.0000 [IU] | Freq: Once | INTRAVENOUS | Status: AC
  Administered 2018-03-31: 10 [IU] via INTRAVENOUS

## 2018-03-31 MED ORDER — DEXTROSE 50 % IV SOLN
50.0000 mL | Freq: Once | INTRAVENOUS | Status: AC
  Administered 2018-03-31: 50 mL via INTRAVENOUS
  Filled 2018-03-31: qty 50

## 2018-03-31 MED ORDER — SODIUM POLYSTYRENE SULFONATE PO POWD
30.0000 g | Freq: Once | ORAL | Status: AC
  Administered 2018-03-31: 30 g via ORAL
  Filled 2018-03-31: qty 30

## 2018-03-31 MED ORDER — CALCIUM GLUCONATE 10 % IV SOLN
1.0000 g | Freq: Once | INTRAVENOUS | Status: AC
  Administered 2018-03-31: 1 g via INTRAVENOUS
  Filled 2018-03-31: qty 10

## 2018-03-31 MED ORDER — DEXTROSE 50 % IV SOLN
1.0000 | Freq: Once | INTRAVENOUS | Status: AC
  Administered 2018-03-31: 50 mL via INTRAVENOUS
  Filled 2018-03-31: qty 50

## 2018-03-31 MED ORDER — SODIUM CHLORIDE 0.9 % IV SOLN
2.0000 g | Freq: Once | INTRAVENOUS | Status: AC
  Administered 2018-03-31: 2 g via INTRAVENOUS
  Filled 2018-03-31: qty 20

## 2018-03-31 MED ORDER — VANCOMYCIN HCL IN DEXTROSE 750-5 MG/150ML-% IV SOLN
750.0000 mg | INTRAVENOUS | Status: DC
  Administered 2018-03-31: 750 mg via INTRAVENOUS
  Filled 2018-03-31 (×2): qty 150

## 2018-03-31 MED ORDER — SODIUM CHLORIDE 0.9 % IV SOLN
1.0000 g | Freq: Once | INTRAVENOUS | Status: AC
  Administered 2018-03-31: 1 g via INTRAVENOUS
  Filled 2018-03-31: qty 10

## 2018-03-31 MED ORDER — SODIUM CHLORIDE 0.9 % IV SOLN
Freq: Once | INTRAVENOUS | Status: AC
  Administered 2018-03-31: 07:00:00 via INTRAVENOUS

## 2018-03-31 MED ORDER — DEXTROSE 50 % IV SOLN
INTRAVENOUS | Status: AC
  Filled 2018-03-31: qty 50

## 2018-03-31 MED ORDER — SODIUM BICARBONATE 8.4 % IV SOLN
INTRAVENOUS | Status: DC
  Administered 2018-03-31 – 2018-04-03 (×7): via INTRAVENOUS
  Filled 2018-03-31 (×13): qty 75

## 2018-03-31 NOTE — Progress Notes (Signed)
CRITICAL VALUE ALERT  Critical Value:  Calcium 6  Date & Time Notied:  03/31/2018 16100647   Provider Notified: Dr. Arsenio LoaderSommer  Orders Received/Actions taken: see MAR

## 2018-03-31 NOTE — Progress Notes (Signed)
eLink Physician-Brief Progress Note Patient Name: Courtney GuthrieDeborah Norton DOB: 02/28/1956 MRN: [id number]   Date of Service  03/31/2018  HPI/Events of Note  Low Ca and High K  eICU Interventions  Insulin/d50 and calcium ordered        Erin FullingKurian Hinley Brimage 03/31/2018, 8:38 PM

## 2018-03-31 NOTE — Progress Notes (Signed)
ANTICOAGULATION CONSULT NOTE - Follow Up Consult  Pharmacy Consult for Heparin Indication: chest pain/ACS  Allergies  Allergen Reactions  . Cefaclor Hives  . Nitrofurantoin Hives and Nausea Only  . Ciclonium   . Macrodantin  [Nitrofurantoin Macrocrystal] Nausea Only  . Doxycycline     Other reaction(s): Abdominal Pain Other reaction(s): Abdominal Pain Other reaction(s): Abdominal Pain    Patient Measurements: Height: 5\' 8"  (172.7 cm) Weight: 229 lb 4.5 oz (104 kg) IBW/kg (Calculated) : 63.9 Heparin Dosing Weight: 83 kg  Vital Signs: Temp: 98.6 F (37 C) (04/10 0700) BP: 131/57 (04/10 0730) Pulse Rate: 87 (04/10 0730)  Labs: Recent Labs    04/14/2018 1808  04/06/2018 2213  03/29/18 0147  03/29/18 0538 03/29/18 0747 03/29/18 1204  03/30/18 0344 03/30/18 0400 03/30/18 0809 03/30/18 0838 03/31/18 0452  HGB 8.4*   < >  --    < >  --   --  7.8*  --   --   --   --  7.0*  --   --  6.3*  HCT 29.6*   < >  --    < >  --   --  27.9*  --   --   --   --  23.4*  --   --  20.8*  PLT 365  --   --   --   --   --  298  --   --   --   --  159  --   --  101*  APTT 41*  --   --   --  104*  --   --   --   --   --   --   --   --   --   --   LABPROT 21.2*  --   --   --  24.2*  --   --  24.2*  --   --   --   --   --   --   --   INR 1.86  --   --   --  2.19  --   --  2.19  --   --   --   --   --   --   --   HEPARINUNFRC  --   --   --   --   --    < > 0.24* 0.31  --   --  0.53  --   --  0.53 0.38  CREATININE 2.74*   < > 2.83*  --  2.83*  --  2.90* 3.01*  --    < >  --  3.09* 3.20*  --  3.94*  TROPONINI 0.40*  --  0.73*  --   --   --  1.10*  --  1.21*  --   --   --   --   --   --    < > = values in this interval not displayed.   Estimated Creatinine Clearance: 18.4 mL/min (A) (by C-G formula based on SCr of 3.94 mg/dL (H)).  Assessment: 49 yoF who presented on 4/7 s/p cardiac arrest. Pharmacy consulted to dose heparin for ACS. No anticoagulation PTA, head CT no active bleeding. Patient now  s/p hypothermia protocol, rewarmed 4/9 AM. Heparin level remains within goal this AM at 0.38, but is decreased from previous level - possibly due to rewarming. Hgb and pltc continue to decrease, now 6.3 and 101, respectively. Received 1 unit PRBC o/n. No overt signs of  bleeding. Low clinical suspicion for HIT, but HIT antibody pending.  Goal of Therapy:  Heparin level 0.3-0.7 units/ml Monitor platelets by anticoagulation protocol: Yes   Plan:  Increase heparin drip 1150 units/hr  Daily heparin level and CBC F/u HIT antibody results Monitor for s/sx of bleeding  Erin N. Deja, PharmD Zigmund [id number] Pharmacy Resident Pager: [phone] 03/31/18  8:08 AM

## 2018-03-31 NOTE — Progress Notes (Addendum)
PULMONARY / CRITICAL CARE MEDICINE   Name: Courtney Norton MRN: [id number] DOB: Nov 08, 1956    ADMISSION DATE:  04/10/2018    REFERRING MD:  None  Interval Hx: Still on multiple pressors. Off sedation since yesterday, pupils 5mm not reactive and no gag. Not opening eyes or following commands.   CHIEF COMPLAINT: Cardiac Arrest  HISTORY OF PRESENT ILLNESS:   The patient  Was found by her significant other frothing at the mouth after he had returned to the hotel rooom after stepping out for a few minutes from what I have been informed. The patient was unarousable and did not appear to be breathing or have a pulse. CPR was started and an ambulance was summoned.  The patient atone time time had coarse v fib and was shocked. Her intial rhythm was a wide complex bradycardia after ROSC. The patient was found to have a profound acidosis. She required bicarb pushes and was placed on a bicarb drip. The patient had a CVP placed in the right groin. She is on Levophed 20 mcg/min and epinephrine 9 mcg/min.  She has elevated LFTs likely due to shock liver. CT of the head was unremarkable. He r intiual trop was 0.06.  The patient apparently had been admitted to the hospital 1 month ago fgor anemia and atrial fibrillation.   PAST MEDICAL HISTORY :  She  has no past medical history on file.  PAST SURGICAL HISTORY: She  has no past surgical history on file.  Allergies  Allergen Reactions  . Cefaclor Hives  . Nitrofurantoin Hives and Nausea Only  . Ciclonium   . Macrodantin  [Nitrofurantoin Macrocrystal] Nausea Only  . Doxycycline     Other reaction(s): Abdominal Pain Other reaction(s): Abdominal Pain Other reaction(s): Abdominal Pain     No current facility-administered medications on file prior to encounter.    Current Outpatient Medications on File Prior to Encounter  Medication Sig  . aspirin EC 81 MG tablet Take 81 mg by mouth daily.  . citalopram (CELEXA) 40 MG tablet Take  40 mg by mouth daily.  . diazepam (VALIUM) 2 MG tablet Take 2 mg by mouth every 8 (eight) hours as needed for anxiety.  Marland Kitchen diltiazem (CARDIZEM CD) 120 MG 24 hr capsule Take by mouth.  . gabapentin (NEURONTIN) 300 MG capsule Take 300 mg by mouth 3 (three) times daily.  Marland Kitchen omeprazole (PRILOSEC) 40 MG capsule Take 40 mg by mouth daily.  Marland Kitchen topiramate (TOPAMAX) 50 MG tablet Take 50 mg by mouth 2 (two) times daily.    FAMILY HISTORY:  Her has no family status information on file.  According to her significant other there is no close family.  SOCIAL HISTORY: Unknown REVIEW OF SYSTEMS:   Not available as the patient is comatose     VITAL SIGNS: BP (!) 135/58 (BP Location: Left Arm)   Pulse 88   Temp 98.6 F (37 C)   Resp (!) 0   Ht 5\' 8"  (1.727 m)   Wt 104 kg (229 lb 4.5 oz)   SpO2 92%   BMI 34.86 kg/m   HEMODYNAMICS: CVP:  [6 mmHg-14 mmHg] 13 mmHgPatient was in profound shock. When she arrived she was on 20 mcg levophed and 9 mcg/min of epinephrine.  VENTILATOR SETTINGS: Vent Mode: PRVC FiO2 (%):  [40 %-50 %] 50 % Set Rate:  [24 bmp] 24 bmp Vt Set:  [490 mL] 490 mL PEEP:  [5 cmH20] 5 cmH20 Plateau Pressure:  [24 cmH20-30 cmH20] 30 cmH20  INTAKE /  OUTPUT: I/O last 3 completed shifts: In: 40981.1 [I.V.:8817.2; NG/GT:10; IV Piggyback:1870] Out: 447 [Urine:222; Emesis/NG output:225]  PHYSICAL EXAMINATION: General: Patient is an elderly female appropriate for her age lying in bed sedated Neuro: No gag or corneal reflex with a negative bilaterally dilated pupils Cardiovascular:  RRR s1S2 Lungs:  Bilateral; BS Abdomen:  Soft , BS, non-tender, no gross oreganomegaly Musculoskeletal:  Not moving, no gross atrophy Exttr; no cyanosis or edema, CVP in the righht groin Skin:  Dry and warm  LABS:  BMET Recent Labs  Lab 03/30/18 0400 03/30/18 0809 03/31/18 0452  NA 134* 135 127*  K 3.7 4.3 6.0*  CL 97* 98* 84*  CO2 22 24 26   BUN 42* 42* 51*  CREATININE 3.09* 3.20* 3.94*   GLUCOSE 145* 115* 146*    Electrolytes Recent Labs  Lab 03/29/18 2339 03/30/18 0400 03/30/18 0809 03/31/18 0452  CALCIUM 6.5* 6.6* 6.3* 6.0*  MG 1.6* 1.6* 1.5*  --   PHOS 5.6* 6.0* 6.2*  --     CBC Recent Labs  Lab 03/29/18 0538 03/30/18 0400 03/31/18 0452  WBC 3.8* 9.2 20.6*  HGB 7.8* 7.0* 6.3*  HCT 27.9* 23.4* 20.8*  PLT 298 159 101*    Coag's Recent Labs  Lab 29-Mar-2018 1808 03/29/18 0147 03/29/18 0747  APTT 41* 104*  --   INR 1.86 2.19 2.19    Sepsis Markers Recent Labs  Lab 03/29/18 1205  03/29/18 2339 03/30/18 0400 03/30/18 0810  LATICACIDVEN  --    < > 4.5* 3.3* 2.7*  PROCALCITON 28.93  --   --  72.07  --    < > = values in this interval not displayed.    ABG Recent Labs  Lab 03/29/2018 2220 03/29/18 0227 03/29/18 0547  PHART 7.121* 7.165* 7.262*  PCO2ART 51.1* 54.3* 45.4  PO2ART 64.4* 110* 88.5    Liver Enzymes Recent Labs  Lab 03/29/18 1619 03/30/18 0400 03/31/18 0452  AST 3,117* 2,057* 986*  ALT 2,306* 2,127* 1,631*  ALKPHOS 79 82 129*  BILITOT 0.7 0.6 0.8  ALBUMIN 2.0* 1.9* 1.7*    Cardiac Enzymes Recent Labs  Lab 03/29/18 2213 03/29/18 0538 03/29/18 1204  TROPONINI 0.73* 1.10* 1.21*    Glucose Recent Labs  Lab 03/30/18 1005 03/30/18 1131 03/30/18 1543 03/30/18 1952 03/30/18 2325 03/31/18 0447  GLUCAP 109* 117* 147* 161* 156* 147*    Imaging Dg Chest Port 1 View  Result Date: 03/31/2018 CLINICAL DATA:  Respiratory failure; ETT present EXAM: PORTABLE CHEST 1 VIEW COMPARISON:  Chest x-rays dated 03/29/2018 and 29-Mar-2018. FINDINGS: Endotracheal tube appears well positioned with tip approximately 2-3 cm above the carina. Cardiomediastinal silhouette is grossly stable in size and configuration, RIGHT heart border now obscured by a RIGHT lower lobe opacity. Pacer pad again overlies the LEFT chest wall. Ill-defined perihilar opacities bilaterally appear stable, again LEFT greater than RIGHT. New dense opacity at the  RIGHT lung base, likely atelectasis and/or small pleural effusion. Multiple RIGHT-sided rib fractures, 1 of which is definitely chronic, the other not discretely seen on previous exams. IMPRESSION: 1. Three RIGHT-sided rib fractures are now appreciated, only 2 of which were confidently seen on the recent exams. New RIGHT-sided rib fracture? 2. Persistent bilateral perihilar airspace opacities, again LEFT greater than RIGHT, most likely pulmonary edema. 3. New dense opacity at the RIGHT lung base, likely atelectasis and/or small pleural effusion. 4. Endotracheal tube appears well positioned. Electronically Signed   By: Bary Richard M.D.   On: 03/31/2018 08:48  LINES/TUBES:  oral ET Arterial line.  CVP OGT   ASSESSMENT / PLAN:  PULMONARY Acute hypoxemic respiratory failure Ventilator dependent respiratory failure secondary to V. fib cardiac arrest Aspiration PNA   Plan: ABG stat  Continue mechanical ventilator current settings. Drop rate depending on the blood gas to assess for any breathing effort VAP precautions. ABx ?asp PNA  Left lower lobe atelectasis seen on chest x-ray.  Continued IV antibiotics   CARDIOVASCULAR V. fib cardiac arrest Shock secondary to cardiac shock versus septic shock. Plan  Titrate vasopressors. Patient is on vasopressin, epi and levophed drips keep map above 65. Follow-up echo showed normal EF. Cardiology consulted.  No acute indication for cardiac cath at this time.  EKG reviewed unremarkable at this time.     RENAL Acute renal failure secondary to prerenal versus renal. Hyperkalemia  Anion gap metabolic acidosis secondary to lactic acidosis Plan: nephro consult  Treat hyperkalmeia with insulin, D50, Kayexalate and Ca Mild improvement in the lactic acidosis.   Lower  sodium bicarb 75 cc/h.   Monitor urine output.  GI Elevated LFTs secondary to ischemic hepatic injury Shocked liver  Plan: Trending down , Follow-up repeat LFTs. Protonix  prophylaxis   HEMATOLOGIC Microcytic anemia  Plan: Follow-up repeat CBC.   No overt signs of bleeding at this time. Unsure patient has chronic anemia  Endocrine. Persistent hyperglycemia possibly secondary to acute inflammatory process.  Plan:  Patient on insulin drip.  Titrate insulin drip for blood sugar range below 200. Follow-up TSH, hemoglobin A1c, lipid panel  NEUROLOGIC Acute encephalopathy secondary to cardiac arrest versus seizure versus CVA van not rule out anoxic brain injury Plan: CT head at this time is negative for any acute intracranial process. Spot EEG does not show acute seizure at this time.  Continue with Keppra IV MRI brain today  Neuro consult   Infectious disease Rule out septic shock Plan: Continue with IV antibiotics at this time.  Follow-up blood cultures. Follow pro-calcitonin.      I have spent 32 mins of CC time bedside or in the unit exclusive of billable proecdures

## 2018-03-31 NOTE — Procedures (Signed)
  ELECTROENCEPHALOGRAM REPORT  Date of Study: 03/31/18  Patient's Name: Courtney Norton MRN: [id number] Date of Birth: May 26, 1956  Referring Provider: Silver Huguenin, MD  Clinical History: Courtney Norton is a 62 y.o. found by her significant other frothing at the mouth after he had returned to the hotel rooom after stepping out for a few minutes.The patient was unarousable and did not appear to be breathing or have a pulse. CPR was started- Vfib, shocked, profound acidosis- has required pressors.  Neg Head CT.  Off sedation since yesterday yet still not opening eyes or responding to command.  On Keppra.  EEG (03/29/18) on sedation notable for diffuse background suppression and lack of EEG reactivity.  MRI head and Neurology consult pending.   Medications: Scheduled Meds: . chlorhexidine gluconate (MEDLINE KIT)  15 mL Mouth Rinse BID  . Chlorhexidine Gluconate Cloth  6 each Topical Daily  . fentaNYL (SUBLIMAZE) injection  100 mcg Intravenous Once  . insulin aspart  0-9 Units Subcutaneous Q4H  . insulin detemir  15 Units Subcutaneous BID  . LORazepam  2 mg Intravenous Once  . mouth rinse  15 mL Mouth Rinse 10 times per day  . pantoprazole (PROTONIX) IV  40 mg Intravenous QHS  . sodium chloride flush  10-40 mL Intracatheter Q12H   Continuous Infusions: . aztreonam Stopped (03/31/18 1332)  . cisatracurium (NIMBEX) infusion Stopped (03/30/18 0430)  . clindamycin (CLEOCIN) IV Stopped (03/31/18 1332)  . epinephrine 12 mcg/min (03/31/18 1840)  . fentaNYL infusion INTRAVENOUS Stopped (03/30/18 0615)  . heparin 1,150 Units/hr (03/31/18 1900)  . levETIRAcetam Stopped (03/31/18 1915)  . midazolam (VERSED) infusion Stopped (03/30/18 9038)  . norepinephrine (LEVOPHED) Adult infusion 38 mcg/min (03/31/18 1856)  .  sodium bicarbonate  infusion 1000 mL 125 mL/hr at 03/31/18 1857  . vancomycin Stopped (03/31/18 1604)  . vasopressin (PITRESSIN) infusion - *FOR SHOCK* Stopped (03/31/18  1110)   PRN Meds:.LORazepam, sodium chloride flush            Technical Summary: This is a standard 16 channel EEG recording performed according to the international 10-20 electrode system.  AP bipolar, transverse bipolar, and referential montages were obtained, and digitally reformatted as necessary.  Duration of tracing: 21:25  Description: Patient is noted to be intubated and off sedation since yesterday.  Background remains suppressed with only EKG artifact observed and no definitive background rhythm at 2uV sensitivity.  There was no EEG reactivity despite various attempts at stimulation.  Neither hyperventilation or photic stimulation were performed.  EKG lead appeared non-operable.  No epileptiform changes observed.  Impression: This remains a very abnormal EEG with diffuse, non-reactive background suppression.  No definitive background rhythm noted down to 2 uV sensitivity, and no cortical reactivity noted despite multiple attempts at stimulation. No epileptiform changes were noted.   In the setting of cardiac arrest, an isoelectric, nonreactive background in the absence of sedative medications or deep hypothermia is generally associated with a poor neurological outcome. Clinical/neurological correlation advised.   Carvel Getting, M.D. Neurology Cell [phone]

## 2018-03-31 NOTE — Progress Notes (Signed)
EEG complete - results pending 

## 2018-03-31 NOTE — Progress Notes (Signed)
CRITICAL VALUE ALERT  Critical Value:  Hem 6.3  Date & Time Notied:  03/31/2018 5:55   Provider Notified: Hosie PoissonSumner  Orders Received/Actions taken: see MAR

## 2018-03-31 NOTE — Progress Notes (Signed)
Pharmacy Antibiotic Note  Courtney GuthrieDeborah Norton is a 62 y.o. female admitted on 04/18/2018 s/p cardiac arrest and now s/p TTM, remains intubated and sedated. Patient continues on vancomycin per pharmacy, aztreonam, and clindamycin for possible PNA/septic shock. WBC up 20.6, currently AF. PCT 72 >> 57 today. Scr has steadily risen since admission, now 3.94 (est CrCl ~24 mL/min).   Plan: Decrease to vancomycin 750mg  IV q24h If renal function continues to decline, may need to stop scheduled vanc and dose per levels Aztreonam and clindamycin per MD Consider discontinuing clindamycin, or adjust aztreo/clinda to meropenem F/u C&S, clinical status, renal function, vanc levels as appropriate  Height: 5\' 8"  (172.7 cm) Weight: 229 lb 4.5 oz (104 kg) IBW/kg (Calculated) : 63.9  Temp (24hrs), Avg:98.6 F (37 C), Min:98.4 F (36.9 C), Max:98.8 F (37.1 C)  Recent Labs  Lab 04/20/2018 1330  04/07/2018 1808  03/29/18 0538  03/29/18 1618  03/29/18 1955 03/29/18 2339 03/30/18 0400 03/30/18 0809 03/30/18 0810 03/30/18 1100 03/31/18 0452 03/31/18 1052  WBC 13.6*  --  15.4*  --  3.8*  --   --   --   --   --  9.2  --   --   --  20.6*  --   CREATININE 2.61*  --  2.74*   < > 2.90*   < >  --    < > 3.01* 3.03* 3.09* 3.20*  --   --  3.94*  --   LATICACIDVEN  --    < >  --    < >  --    < > 6.5*  --  5.7* 4.5* 3.3*  --  2.7*  --   --   --   VANCOTROUGH  --   --   --   --   --   --   --   --   --   --   --   --   --   --   --  21*  VANCORANDOM  --   --   --   --   --   --   --   --   --   --   --   --   --  16  --   --    < > = values in this interval not displayed.    Estimated Creatinine Clearance: 18.4 mL/min (A) (by C-G formula based on SCr of 3.94 mg/dL (H)).    Antimicrobials this admission: Aztreonam 4/8 >> Clinda 4/8 >> Vanc 4/8 >>  Dose adjustments this admission: 4/9 VR (24 hrs post-load) = 16  4/10 VT = 21 on 1000mg  IV q24h >> dec to 750mg  IV q24h  Microbiology results: 4/7 MRSA PCR  negative  Erin N. Zigmund Danieleja, PharmD PGY1 Pharmacy Resident Pager: [phone] 03/31/18   1:50 PM

## 2018-03-31 NOTE — Progress Notes (Signed)
eLink Physician-Brief Progress Note Patient Name: Courtney GuthrieDeborah Caskey-Slamon DOB: 04/25/1956 MRN: [id number]   Date of Service  03/31/2018  HPI/Events of Note  Anemia - Hgb = 6.3.  eICU Interventions  Will transfuse 1 unit PRBC.     Intervention Category Major Interventions: Other:  Sommer,Steven Dennard Nipugene 03/31/2018, 6:06 AM

## 2018-03-31 NOTE — Progress Notes (Signed)
CRITICAL VALUE ALERT  Critical Value:  K 5.9, Calcium 6.0  Date & Time Notied:  03/31/18 1930   Provider Notified: Pola CornElink RN Merci  Orders Received:  Awaiting orders.

## 2018-03-31 NOTE — Progress Notes (Signed)
eLink Physician-Brief Progress Note Patient Name: Courtney GuthrieDeborah Norton DOB: 07/08/1956 MRN: [id number]   Date of Service  03/31/2018  HPI/Events of Note  Ca++ = 6.0 and Albumin = 1.9.  eICU Interventions  Will replace Ca++.     Intervention Category Major Interventions: Electrolyte abnormality - evaluation and management  Courtney Norton 03/31/2018, 6:49 AM

## 2018-04-01 DIAGNOSIS — G931 Anoxic brain damage, not elsewhere classified: Secondary | ICD-10-CM

## 2018-04-01 DIAGNOSIS — R40243 Glasgow coma scale score 3-8, unspecified time: Secondary | ICD-10-CM

## 2018-04-01 DIAGNOSIS — Z7189 Other specified counseling: Secondary | ICD-10-CM

## 2018-04-01 LAB — BASIC METABOLIC PANEL
ANION GAP: 17 — AB (ref 5–15)
Anion gap: 16 — ABNORMAL HIGH (ref 5–15)
BUN: 58 mg/dL — ABNORMAL HIGH (ref 6–20)
BUN: 60 mg/dL — AB (ref 6–20)
CALCIUM: 6.1 mg/dL — AB (ref 8.9–10.3)
CALCIUM: 6 mg/dL — AB (ref 8.9–10.3)
CO2: 26 mmol/L (ref 22–32)
CO2: 26 mmol/L (ref 22–32)
CREATININE: 4.44 mg/dL — AB (ref 0.44–1.00)
CREATININE: 4.57 mg/dL — AB (ref 0.44–1.00)
Chloride: 80 mmol/L — ABNORMAL LOW (ref 101–111)
Chloride: 77 mmol/L — ABNORMAL LOW (ref 101–111)
GFR calc Af Amer: 11 mL/min — ABNORMAL LOW (ref >60)
GFR, EST AFRICAN AMERICAN: 11 mL/min — AB (ref >60)
GFR, EST NON AFRICAN AMERICAN: 10 mL/min — AB (ref >60)
GFR, EST NON AFRICAN AMERICAN: 9 mL/min — AB (ref >60)
GLUCOSE: 181 mg/dL — AB (ref 65–99)
Glucose, Bld: 80 mg/dL (ref 65–99)
Potassium: 6.5 mmol/L (ref 3.5–5.1)
Potassium: 6.4 mmol/L (ref 3.5–5.1)
SODIUM: 122 mmol/L — AB (ref 135–145)
Sodium: 120 mmol/L — ABNORMAL LOW (ref 135–145)

## 2018-04-01 LAB — GLUCOSE, CAPILLARY
GLUCOSE-CAPILLARY: 67 mg/dL (ref 65–99)
GLUCOSE-CAPILLARY: 73 mg/dL (ref 65–99)
GLUCOSE-CAPILLARY: 77 mg/dL (ref 65–99)
GLUCOSE-CAPILLARY: 81 mg/dL (ref 65–99)
Glucose-Capillary: 83 mg/dL (ref 65–99)
Glucose-Capillary: 69 mg/dL (ref 65–99)
Glucose-Capillary: 78 mg/dL (ref 65–99)
Glucose-Capillary: 68 mg/dL (ref 65–99)

## 2018-04-01 LAB — HEPATIC FUNCTION PANEL
ALT: 1018 U/L — AB (ref 14–54)
AST: 377 U/L — AB (ref 15–41)
Albumin: 1.6 g/dL — ABNORMAL LOW (ref 3.5–5.0)
Alkaline Phosphatase: 126 U/L (ref 38–126)
BILIRUBIN DIRECT: 0.6 mg/dL — AB (ref 0.1–0.5)
Indirect Bilirubin: 0.6 mg/dL (ref 0.3–0.9)
Total Bilirubin: 1.2 mg/dL (ref 0.3–1.2)
Total Protein: 4.3 g/dL — ABNORMAL LOW (ref 6.5–8.1)

## 2018-04-01 LAB — CBC
HCT: 20 % — ABNORMAL LOW (ref 36.0–46.0)
Hemoglobin: 6.5 g/dL — CL (ref 12.0–15.0)
MCH: 19.9 pg — ABNORMAL LOW (ref 26.0–34.0)
MCHC: 32.5 g/dL (ref 30.0–36.0)
MCV: 61.2 fL — AB (ref 78.0–100.0)
Platelets: 79 10*3/uL — ABNORMAL LOW (ref 150–400)
RBC: 3.27 MIL/uL — AB (ref 3.87–5.11)
RDW: 29 % — ABNORMAL HIGH (ref 11.5–15.5)
WBC: 15.1 10*3/uL — AB (ref 4.0–10.5)

## 2018-04-01 LAB — HEPARIN LEVEL (UNFRACTIONATED): HEPARIN UNFRACTIONATED: 0.1 [IU]/mL — AB (ref 0.30–0.70)

## 2018-04-01 LAB — PREPARE RBC (CROSSMATCH)

## 2018-04-01 LAB — VANCOMYCIN, RANDOM: Vancomycin Rm: 26

## 2018-04-01 MED ORDER — LEVETIRACETAM IN NACL 500 MG/100ML IV SOLN
500.0000 mg | Freq: Two times a day (BID) | INTRAVENOUS | Status: DC
  Administered 2018-04-01 – 2018-04-03 (×4): 500 mg via INTRAVENOUS
  Filled 2018-04-01 (×6): qty 100

## 2018-04-01 MED ORDER — SODIUM CHLORIDE 0.9 % IV SOLN: Freq: Once | INTRAVENOUS | Status: DC

## 2018-04-01 MED ORDER — DEXTROSE 50 % IV SOLN
25.0000 g | Freq: Once | INTRAVENOUS | Status: AC
  Administered 2018-04-01: 25 g via INTRAVENOUS

## 2018-04-01 MED ORDER — SODIUM POLYSTYRENE SULFONATE PO POWD
60.0000 g | Freq: Once | ORAL | Status: AC
  Administered 2018-04-01: 60 g
  Filled 2018-04-01: qty 60

## 2018-04-01 MED ORDER — VANCOMYCIN HCL IN DEXTROSE 1-5 GM/200ML-% IV SOLN
1000.0000 mg | INTRAVENOUS | Status: DC
  Administered 2018-04-02: 1000 mg via INTRAVENOUS
  Filled 2018-04-01: qty 200

## 2018-04-01 MED ORDER — DEXTROSE 50 % IV SOLN
INTRAVENOUS | Status: AC
  Administered 2018-04-01: 25 mL
  Filled 2018-04-01: qty 50

## 2018-04-01 MED ORDER — DEXTROSE 50 % IV SOLN
INTRAVENOUS | Status: AC
  Administered 2018-04-01: 20:00:00
  Filled 2018-04-01: qty 50

## 2018-04-01 NOTE — Progress Notes (Signed)
Spoke with Marylene LandAngela, Poison control.  Updated as to results of EEG and MRI, DNR status.  Informed by Marylene LandAngela that there will be no further interventions from Poison Control at this time.

## 2018-04-01 NOTE — Progress Notes (Signed)
Responded to unit page and scc to come talk to patient boyfriend.  Patient is none responsive .  Patient boyfriend need assist with housing he is concern about where he will sleep tonight. He is homeless. I spoke with him about several resources he can check into.  Will follow as needed.  Venida JarvisWatlington, Carder Yin, [place], [facility], Pager [phone]

## 2018-04-01 NOTE — Progress Notes (Signed)
Spoke with son and husband.  After a long discussion, decision was made to make patient a full DNR and they will decide on timing for withdrawal of care after another discussion tomorrow.    The patient is critically ill with multiple organ systems failure and requires high complexity decision making for assessment and support, frequent evaluation and titration of therapies, application of advanced monitoring technologies and extensive interpretation of multiple databases.   Critical Care Time devoted to patient care services described in this note is  35  Minutes. This time reflects time of care of this signee Dr Koren BoundWesam Shamal Stracener. This critical care time does not reflect procedure time, or teaching time or supervisory time of PA/NP/Med student/Med Resident etc but could involve care discussion time.  Alyson ReedyWesam G. Bethanny Toelle, M.D. [facility] Pulmonary/Critical Care Medicine. Pager: [phone]. After hours pager: [phone].

## 2018-04-01 NOTE — Progress Notes (Signed)
CCM rounding. Per NP,  hold off on giving unit of blood until discussion with family can be made regarding family's wishes for end of life care.

## 2018-04-01 NOTE — Progress Notes (Signed)
Pharmacy Antibiotic Note  Courtney GuthrieDeborah Norton is a 62 y.o. female admitted on 04/13/2018 s/p cardiac arrest and now s/p TTM, remains intubated and sedated. Patient continues on vancomycin per pharmacy, aztreonam, and clindamycin for possible PNA/septic shock. WBC dow to 15, currently AF. PCT 72 >> 57 today. Scr has steadily risen since admission, now 4.6 (est CrCl ~15 mL/min).   Random vancomycin drawn this afternoon given continued worsening of renal function. Level of 26 drawn ~21 hours after last dose, will hold dose today and start q48h dosing in am. Will continue to follow prognosis and plan closely.   Plan: Decrease vancomycin 1000mg  IV q48h Aztreonam and clindamycin per MD Consider discontinuing clindamycin, or adjust aztreo/clinda to meropenem F/u C&S, clinical status, renal function, vanc levels as appropriate  Height: 5\' 8"  (172.7 cm) Weight: 229 lb 4.5 oz (104 kg) IBW/kg (Calculated) : 63.9  Temp (24hrs), Avg:98.4 F (36.9 C), Min:97.7 F (36.5 C), Max:98.6 F (37 C)  Recent Labs  Lab 03-20-18 1808  03/29/18 0538  03/29/18 1618  03/29/18 1955 03/29/18 2339 03/30/18 0400 03/30/18 0809 03/30/18 0810 03/30/18 1100 03/31/18 0452 03/31/18 1052 03/31/18 1511 04/01/18 0415 04/01/18 0658 04/01/18 1134 04/01/18 1150  WBC 15.4*  --  3.8*  --   --   --   --   --  9.2  --   --   --  20.6*  --   --  15.1*  --   --   --   CREATININE 2.74*   < > 2.90*   < >  --    < > 3.01* 3.03* 3.09* 3.20*  --   --  3.94*  --  4.13*  --  4.44* 4.57*  --   LATICACIDVEN  --    < >  --    < > 6.5*  --  5.7* 4.5* 3.3*  --  2.7*  --   --   --   --   --   --   --   --   VANCOTROUGH  --   --   --   --   --   --   --   --   --   --   --   --   --  21*  --   --   --   --   --   VANCORANDOM  --   --   --   --   --   --   --   --   --   --   --  16  --   --   --   --   --   --  26   < > = values in this interval not displayed.    Estimated Creatinine Clearance: 15.9 mL/min (A) (by C-G formula based  on SCr of 4.57 mg/dL (H)).    Antimicrobials this admission: Aztreonam 4/8 >> Clinda 4/8 >> Vanc 4/8 >>  Dose adjustments this admission: 4/9 VR (24 hrs post-load) = 16  4/10 VT = 21 on 1000mg  IV q24h >> dec to 750mg  IV q24h 4/11: VR 26 drawn ~21h from last dose, will order 1g q48h and follow up overall plan/prognosis  Microbiology results: 4/7 MRSA PCR negative  Courtney CoilFrank Lindey Norton PharmD., BCPS Clinical Pharmacist 04/01/2018 1:44 PM

## 2018-04-01 NOTE — Progress Notes (Addendum)
PULMONARY / CRITICAL CARE MEDICINE   Name: Courtney Norton MRN: [id number] DOB: 02/02/1956    ADMISSION DATE:  03/26/2018    REFERRING MD:  None     CHIEF COMPLAINT: Cardiac Arrest  HISTORY OF PRESENT ILLNESS:   62 year old found down in a motel room with cardiac arrest transported to [facility].  The patient apparently had been admitted to the hospital 1 month ago fgor anemia and atrial fibrillation.   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  Will need to have discussions with family concerning poor outcome and possible withdrawal of care.     VITAL SIGNS: BP (!) 142/64   Pulse 82   Temp 98.6 F (37 C) (Bladder)   Resp (!) 26   Ht 5\' 8"  (1.727 m)   Wt 104 kg (229 lb 4.5 oz)   SpO2 98%   BMI 34.86 kg/m   HEMODYNAMICS: CVP:  [11 mmHg-12 mmHg] 12 mmHgPatient was in profound shock. When she arrived she was on 20 mcg levophed and 9 mcg/min of epinephrine.  VENTILATOR SETTINGS: Vent Mode: PRVC FiO2 (%):  [50 %] 50 % Set Rate:  [24 bmp] 24 bmp Vt Set:  [490 mL] 490 mL PEEP:  [5 cmH20] 5 cmH20 Plateau Pressure:  [28 cmH20-32 cmH20] 28 cmH20  INTAKE / OUTPUT: I/O last 3 completed shifts: In: 8737.6 [I.V.:7692.6; Blood:315; NG/GT:10; IV Piggyback:720] Out: 435 [Urine:185; Emesis/NG output:250]  PHYSICAL EXAMINATION: General: Obese female who is been off sedation for 48 hours. HEENT: Endotracheal tube in place connected to ventilator.  Oral secretions are bloody.  Note she is on a heparin drip PSY: None available Neuro: Negative doll's eyes.  No corneal reflex.  No gag reflex.  Pupils are fixed and dilated at 4 mm CV: Heart sounds are regular PULM: Rhonchi bilaterally GI: Soft faint bowel sounds Extremities: warm/dry, 1+ edema  Skin: no rashes or lesions   LABS:  BMET Recent Labs  Lab 03/31/18 0452 03/31/18 1511 04/01/18 0658  NA 127* 123* 122*   K 6.0* 5.9* 6.5*  CL 84* 84* 80*  CO2 26 26 26   BUN 51* 55* 58*  CREATININE 3.94* 4.13* 4.44*  GLUCOSE 146* 124* 80    Electrolytes Recent Labs  Lab 03/29/18 2339 03/30/18 0400 03/30/18 0809 03/31/18 0452 03/31/18 1511 04/01/18 0658  CALCIUM 6.5* 6.6* 6.3* 6.0* 6.0* 6.1*  MG 1.6* 1.6* 1.5* 1.8  --   --   PHOS 5.6* 6.0* 6.2*  --   --   --     CBC Recent Labs  Lab 03/30/18 0400 03/31/18 0452 04/01/18 0415  WBC 9.2 20.6* 15.1*  HGB 7.0* 6.3* 6.5*  HCT 23.4* 20.8* 20.0*  PLT 159 101* 79*    Coag's Recent Labs  Lab 04/05/2018 1808 03/29/18 0147 03/29/18 0747  APTT 41* 104*  --   INR 1.86 2.19 2.19    Sepsis Markers Recent Labs  Lab 03/29/18 1205  03/29/18 2339 03/30/18 0400 03/30/18 0810 03/31/18 0452  LATICACIDVEN  --    < > 4.5* 3.3* 2.7*  --   PROCALCITON 28.93  --   --  72.07  --  56.54   < > = values in this interval not displayed.    ABG Recent Labs  Lab 03/29/18 0227 03/29/18 0547 03/31/18 1053  PHART 7.165* 7.262* 7.369  PCO2ART 54.3* 45.4 47.5  PO2ART 110* 88.5 59.0*    Liver Enzymes Recent Labs  Lab  03/29/18 1619 03/30/18 0400 03/31/18 0452  AST 3,117* 2,057* 986*  ALT 2,306* 2,127* 1,631*  ALKPHOS 79 82 129*  BILITOT 0.7 0.6 0.8  ALBUMIN 2.0* 1.9* 1.7*    Cardiac Enzymes Recent Labs  Lab 2018/04/19 2213 03/29/18 0538 03/29/18 1204  TROPONINI 0.73* 1.10* 1.21*    Glucose Recent Labs  Lab 03/31/18 1257 03/31/18 1510 03/31/18 2016 03/31/18 2354 04/01/18 0355 04/01/18 0850  GLUCAP 144* 124* 83 73 77 81    Imaging Mr Brain Wo Contrast  Addendum Date: 03/31/2018   ADDENDUM REPORT: 03/31/2018 18:38 ADDENDUM: Critical Value/emergent results were called by telephone at the time of interpretation on 03/31/2018 at 6:38 pm to Dr. Mateo Flow , who verbally acknowledged these results. Electronically Signed   By: Awilda Metro M.D.   On: 03/31/2018 18:38   Result Date: 03/31/2018 CLINICAL DATA:  Altered level  consciousness, status post cardiac arrest April 8th. EXAM: MRI HEAD WITHOUT CONTRAST TECHNIQUE: Multiplanar, multiecho pulse sequences of the brain and surrounding structures were obtained without intravenous contrast. COMPARISON:  CT HEAD 19-Apr-2018 FINDINGS: INTRACRANIAL CONTENTS: Extensive supra-and infratentorial cortical reduced diffusion in addition to patchy reduced diffusion bilateral basal ganglia with intermediate ADC values associated with FLAIR T2 hyperintense signal. Extensive abnormally prominent susceptibility artifact throughout the cerebellum and, within cortical veins associated with abnormal FLAIR T2 hyperintense signal. Severe global edema and effaced cerebral spinal fluid spaces including effaced basal cisterns with mass effect on brainstem. Upward and downward cerebellar herniation, cerebellar tonsils descend 2.3 cm below the foramen magnum compressing the cervical spinal cord. Cystic multiple prominent flow voids at craniocervical junction paddle with sluggish flow. VASCULAR: Attenuated bilateral internal carotid artery and vertebral artery flow voids. SKULL AND UPPER CERVICAL SPINE: No abnormal sellar expansion. No suspicious calvarial bone marrow signal. Craniocervical junction maintained. SINUSES/ORBITS: Moderate paranasal sinus mucosal thickening. Bilateral mastoid effusions.The included ocular globes and orbital contents are non-suspicious. OTHER: None. IMPRESSION: 1. Severe hypoxic ischemic encephalopathy. Severe global parenchymal edema with cerebellar herniation resulting in spinal cord compression. 2. Prominent cortical veins consistent with slow flow. Attenuated/slow flow internal carotid arteries and vertebral arteries. Electronically Signed: By: Awilda Metro M.D. On: 03/31/2018 18:30       LINES/TUBES:  oral ET Arterial line.  CVP OGT   ASSESSMENT / PLAN:  PULMONARY Acute hypoxemic respiratory failure Ventilator dependent respiratory failure secondary to V.  fib cardiac arrest Aspiration PNA   Plan: Vent bundle Currently overbreathing the vent Currently on antimicrobial therapy including vancomycin Cultures ordered 04/01/2018  CARDIOVASCULAR V. fib cardiac arrest Shock secondary to cardiac shock versus septic shock. History of atrial fibrillation Thrombocytopenia Plan  Titrate vasopressors will wean epinephrine first and then wean Levophed We will stop heparin drip due to hemoglobin being less than 7 and involving thrombocytopenia      RENAL Acute renal failure secondary to prerenal versus renal. Hyperkalemia  Anion gap metabolic acidosis secondary to lactic acidosis Lab Results  Component Value Date   CREATININE 4.44 (H) 04/01/2018   CREATININE 4.13 (H) 03/31/2018   CREATININE 3.94 (H) 03/31/2018   Recent Labs  Lab 03/31/18 0452 03/31/18 1511 04/01/18 0658  K 6.0* 5.9* 6.5*   Lactic Acid, Venous    Component Value Date/Time   LATICACIDVEN 2.7 (HH) 03/30/2018 0810    Plan: nephro consult has not been called as 04/01/2018.  Due to severe neurological injury will hold off calling nephrology if can continued aggressive care as needed will consult nephrology. Kayexalate.    sodium bicarb 75 cc/h.  Scant urine output  GI Elevated LFTs secondary to ischemic hepatic injury Shocked liver  Plan: 04/01/2018 repeat LFT ordered. Protonix prophylaxis   HEMATOLOGIC Microcytic anemia Recent Labs    03/31/18 0452 04/01/18 0415  HGB 6.3* 6.5*     Plan: 04/01/2018 hemoglobin 6.5 unit of blood is been ordered per evening. We will have discussion with family prior to transfusion to ensure that we will continue aggressive care.   Endocrine. CBG (last 3)  Recent Labs    03/31/18 2354 04/01/18 0355 04/01/18 0850  GLUCAP 73 77 81    Previous hypercalcemia that is well controlled  Plan:  Sliding scale insulin Insulin drip has been turned off  NEUROLOGIC Acute encephalopathy secondary to cardiac arrest versus  seizure versus CVA van not rule out anoxic brain injury Plan: MRI shows severe anoxic brain injury with cerebellar herniation on 03/31/2018 Extremely poor prognosis we will need to discuss with family that there is a boyfriend and ex-husband questionable who is power of attorney at this time.   Infectious disease Rule out septic shock Plan: Currently on Azactam started 04/16/2018>> Clindamycin started 04/05/2018>> Vancomycin started 03/31/2018>> No positive culture data does not appear to have blood cultures were ordered on 03/29/2018 Blood cultures ordered 04/01/2018 along with respiratory culture  cct per app 45 min  Brett CanalesSteve Minor ACNP [facility] PCCM Pager [phone] till 1 pm If no answer page [phone] 04/01/2018, 9:06 AM'  Attending Note:  62 year old s/p cardiac arrest that underwent hypothermia protocol.  Patient was not responding neurologically and MRI was ordered.  On exam, she is completely unresponsive with a very weak respiratory drive.  I reviewed MRI myself, evidence of herniation and severe anoxic injury noted.  I spoke with the patient's significant other bedside, unfortunately there are no legal paper work.  The social situation is very complicated.  He reports that husband is estranged but they live in the same house.  Patient was found in a motel.  I spoke with the son over the phone and he requests that Jonny RuizJohn makes not decisions.  Son and husband are to arrive this after afternoon for a family meeting but per Jonny RuizJohn husband has a TBI and abuses alcohol and may get violent.  Contacted [place] and awaiting a call back.  Neurology consult already called for prognostication.  Will f/u this afternoon.  The patient is critically ill with multiple organ systems failure and requires high complexity decision making for assessment and support, frequent evaluation and titration of therapies, application of advanced monitoring technologies and extensive interpretation of multiple databases.    Critical Care Time devoted to patient care services described in this note is  60  Minutes. This time reflects time of care of this signee Dr Koren BoundWesam Yacoub. This critical care time does not reflect procedure time, or teaching time or supervisory time of PA/NP/Med student/Med Resident etc but could involve care discussion time.  Alyson ReedyWesam G. Yacoub, M.D. [facility] Pulmonary/Critical Care Medicine. Pager: [phone]. After hours pager: [phone].

## 2018-04-01 NOTE — Progress Notes (Signed)
CRITICAL VALUE ALERT  Critical Value: K - 6.5, Calcium - 6.1  Date & Time Notied: 04/01/18 08  Provider Notified: Devra DoppSteve Minor, NP  Orders Received/Actions taken: Awaiting orders

## 2018-04-01 NOTE — Consult Note (Addendum)
                    NEURO HOSPITALIST CONSULT NOTE   Requesting physician: Dr. Jacoub  Reason for Consult: Anoxic brain injury  History obtained from:    Chart    HPI:                                                                                                                                          Courtney Norton is an 61 y.o. female who was found down in a motel room with cardiac arrest. She was transported to [facility] and admitted on 4/7. She apparently had also been admitted to the hospital 1 month ago for anemia and atrial fibrillation. During her stay here she has continued to decline and now has overall clinical picture most consistent with severe anoxic brain injury.  MRI today also consistent with this, revealing multifocal abnormal signal, brain swelling with mass effect and cerebellar herniation.   PMHx:  Atrial fibrillation Anemia EtOH abuse Closed fracture of tibia Fibromyalgia GERD IBS MDS HTN Migraine Osteoarthritis Prior episode of ARF   No family history on file.             Social History:  has no tobacco, alcohol, and drug history on file.  Allergies  Allergen Reactions  . Cefaclor Hives  . Nitrofurantoin Hives and Nausea Only  . Ciclonium   . Macrodantin  [Nitrofurantoin Macrocrystal] Nausea Only  . Doxycycline     Other reaction(s): Abdominal Pain Other reaction(s): Abdominal Pain Other reaction(s): Abdominal Pain     MEDICATIONS:                                                                                                                     Scheduled: . chlorhexidine gluconate (MEDLINE KIT)  15 mL Mouth Rinse BID  . Chlorhexidine Gluconate Cloth  6 each Topical Daily  . dextrose      . fentaNYL (SUBLIMAZE) injection  100 mcg Intravenous Once  . insulin aspart  0-9 Units Subcutaneous Q4H  . insulin detemir  15 Units Subcutaneous BID  . LORazepam  2 mg Intravenous Once  . mouth rinse  15 mL Mouth Rinse 10 times per  day  . pantoprazole (PROTONIX) IV  40 mg Intravenous QHS  . sodium chloride flush  10-40 mL Intracatheter Q12H   Continuous: . sodium   chloride 10 mL/hr at 04/01/18 2000  . aztreonam Stopped (04/01/18 1504)  . cisatracurium (NIMBEX) infusion Stopped (03/30/18 0430)  . clindamycin (CLEOCIN) IV Stopped (04/01/18 1536)  . epinephrine 4 mcg/min (04/01/18 2000)  . fentaNYL infusion INTRAVENOUS Stopped (03/30/18 0615)  . levETIRAcetam 500 mg (04/01/18 1845)  . norepinephrine (LEVOPHED) Adult infusion 12.053 mcg/min (04/01/18 2000)  .  sodium bicarbonate  infusion 1000 mL 125 mL/hr at 04/01/18 2000  . [START ON 04/02/2018] vancomycin    . vasopressin (PITRESSIN) infusion - *FOR SHOCK* Stopped (03/31/18 1110)     ROS:                                                                                                                                       Unable to obtain due to coma.  Blood pressure (!) 126/57, pulse 80, temperature 98.6 F (37 C), temperature source Bladder, resp. rate (!) 26, height 5' 8" (1.727 m), weight 104 kg (229 lb 4.5 oz), SpO2 99 %.   General Examination:                                                                                                      Physical Exam  HEENT-  [place]/AT   Lungs- Intubated Extremities- Warm and well perfused  Neurological Examination Mental Status: Comatose. No movement to any stimuli. No responses to verbal stimuli. No attempts to communicated. Eyes remain shut with all stimuli.   Cranial Nerves: II: Pupils 4 mm and unreactive. No blink to threat.   III,IV, VI: Eyes closed bilaterally at baseline and do not blink when opened. Eyes at midline. No doll's eye reflex present.  V,VII: Face flaccidly symmetric. No corneal reflexes VIII: No response to voice IX,X: Intubated XI: No movement XII: Intubated Motor/Sensory: Flaccid tone x 4. No movement to any stimuli.  Deep Tendon Reflexes: 0 all 4 extremities.  Plantars: Mute  bilaterally Cerebellar/Gait: Unable to assess   Lab Results: Basic Metabolic Panel: Recent Labs  Lab 03/29/18 1619 03/29/18 1955 03/29/18 2339 03/30/18 0400 03/30/18 0809 03/31/18 0452 03/31/18 1511 04/01/18 0658  NA 135 133* 134* 134* 135 127* 123* 122*  K 3.1* 3.6 3.5 3.7 4.3 6.0* 5.9* 6.5*  CL 100* 101 100* 97* 98* 84* 84* 80*  CO2 18* 19* 20* _0 GLUCOSE 422* 352* 221* 145* 115* 146* 124* 80  BUN 40* 40* 41* 42* 42* 51* 55* 58*  CREATININE 3.02* 3.01* 3.03* 3.09* 3.20* 3.94* 4.13* 4.44*  CALCIUM 7.0* 6.8* 6.5* 6.6* 6.3* 6.0* 6.0* 6.1*  MG 1.9 1.7 1.6* 1.6* 1.5* 1.8  --   --   PHOS 3.8 4.6 5.6* 6.0* 6.2*  --   --   --     CBC: Recent Labs  Lab 04/12/2018 1808  04/06/2018 2236 03/29/18 0538 03/30/18 0400 03/31/18 0452 04/01/18 0415  WBC 15.4*  --   --  3.8* 9.2 20.6* 15.1*  HGB 8.4*   < > 9.5* 7.8* 7.0* 6.3* 6.5*  HCT 29.6*   < > 28.0* 27.9* 23.4* 20.8* 20.0*  MCV 67.0*  --   --  65.3* 61.1* 60.1* 61.2*  PLT 365  --   --  298 159 101* 79*   < > = values in this interval not displayed.    Cardiac Enzymes: Recent Labs  Lab 03/29/2018 1330 04/12/2018 1808 04/08/2018 2213 03/29/18 0538 03/29/18 1204  TROPONINI 0.06* 0.40* 0.73* 1.10* 1.21*    Lipid Panel: Recent Labs  Lab 03/30/18 0809  CHOL 63  TRIG 34  HDL 27*  CHOLHDL 2.3  VLDL 7  LDLCALC 29    Imaging: Mr Brain Wo Contrast  Addendum Date: 03/31/2018   ADDENDUM REPORT: 03/31/2018 18:38 ADDENDUM: Critical Value/emergent results were called by telephone at the time of interpretation on 03/31/2018 at 6:38 pm to Dr. Silver Huguenin , who verbally acknowledged these results. Electronically Signed   By: Elon Alas M.D.   On: 03/31/2018 18:38   Result Date: 03/31/2018 CLINICAL DATA:  Altered level consciousness, status post cardiac arrest April 8th. EXAM: MRI HEAD WITHOUT CONTRAST TECHNIQUE: Multiplanar, multiecho pulse sequences of the brain and surrounding structures were obtained without  intravenous contrast. COMPARISON:  CT HEAD March 28, 2018 FINDINGS: INTRACRANIAL CONTENTS: Extensive supra-and infratentorial cortical reduced diffusion in addition to patchy reduced diffusion bilateral basal ganglia with intermediate ADC values associated with FLAIR T2 hyperintense signal. Extensive abnormally prominent susceptibility artifact throughout the cerebellum and, within cortical veins associated with abnormal FLAIR T2 hyperintense signal. Severe global edema and effaced cerebral spinal fluid spaces including effaced basal cisterns with mass effect on brainstem. Upward and downward cerebellar herniation, cerebellar tonsils descend 2.3 cm below the foramen magnum compressing the cervical spinal cord. Cystic multiple prominent flow voids at craniocervical junction paddle with sluggish flow. VASCULAR: Attenuated bilateral internal carotid artery and vertebral artery flow voids. SKULL AND UPPER CERVICAL SPINE: No abnormal sellar expansion. No suspicious calvarial bone marrow signal. Craniocervical junction maintained. SINUSES/ORBITS: Moderate paranasal sinus mucosal thickening. Bilateral mastoid effusions.The included ocular globes and orbital contents are non-suspicious. OTHER: None. IMPRESSION: 1. Severe hypoxic ischemic encephalopathy. Severe global parenchymal edema with cerebellar herniation resulting in spinal cord compression. 2. Prominent cortical veins consistent with slow flow. Attenuated/slow flow internal carotid arteries and vertebral arteries. Electronically Signed: By: Elon Alas M.D. On: 03/31/2018 18:30   Dg Chest Port 1 View  Result Date: 03/31/2018 CLINICAL DATA:  Respiratory failure; ETT present EXAM: PORTABLE CHEST 1 VIEW COMPARISON:  Chest x-rays dated 03/29/2018 and 03/25/2018. FINDINGS: Endotracheal tube appears well positioned with tip approximately 2-3 cm above the carina. Cardiomediastinal silhouette is grossly stable in size and configuration, RIGHT heart border now  obscured by a RIGHT lower lobe opacity. Pacer pad again overlies the LEFT chest wall. Ill-defined perihilar opacities bilaterally appear stable, again LEFT greater than RIGHT. New dense opacity at the RIGHT lung base, likely atelectasis and/or small pleural effusion. Multiple RIGHT-sided rib fractures, 1 of which is definitely chronic, the other not discretely seen on previous exams.  IMPRESSION: 1. Three RIGHT-sided rib fractures are now appreciated, only 2 of which were confidently seen on the recent exams. New RIGHT-sided rib fracture? 2. Persistent bilateral perihilar airspace opacities, again LEFT greater than RIGHT, most likely pulmonary edema. 3. New dense opacity at the RIGHT lung base, likely atelectasis and/or small pleural effusion. 4. Endotracheal tube appears well positioned. Electronically Signed   By: Stan  Maynard M.D.   On: 03/31/2018 08:48   EEG report, 4/10:  Description: Patient is noted to be intubated and off sedation since yesterday.  Background remains suppressed with only EKG artifact observed and no definitive background rhythm at 2uV sensitivity.  There was no EEG reactivity despite various attempts at stimulation.  Neither hyperventilation or photic stimulation were performed.  EKG lead appeared non-operable.  No epileptiform changes observed. Impression: This remains a very abnormal EEG with diffuse, non-reactive background suppression.  No definitive background rhythm noted down to 2 uV sensitivity, and no cortical reactivity noted despite multiple attempts at stimulation. No epileptiform changes were noted.   In the setting of cardiac arrest, an isoelectric, nonreactive background in the absence of sedative medications or deep hypothermia is generally associated with a poor neurological outcome. Clinical/neurological correlation advised.   Assessment: 61 year old female with severe hypoxic-ischemic brain injury 1. Exam reveals comatose patient with no brainstem reflexes other  than respiratory drive. Of note, some meds with sedating and paralytic effects were on board at time of exam.  2. MRI findings consistent with severe hypoxic ischemic encephalopathy. There is severe global parenchymal edema with cerebellar herniation resulting in spinal cord compression. Prominent cortical veins consistent with slow flow. Attenuated/slow flow internal carotid arteries and vertebral arteries. 3. EEG with  diffuse, non-reactive background suppression.  No definitive background rhythm noted down to 2 uV sensitivity, and no cortical reactivity noted despite multiple attempts at stimulation. Such EEG findings are generally associated with a poor neurological outcome.   Recommendations: 1. Discuss high likelihood of a poor neurological prognosis with family, but will need to repeat exam off all sedatives and paralytic for 24 hours to confirm the findings seen on today's exam.  2. Continue supportive care for now.   40 minutes spent in the neurological evaluation and management of this critically ill patient   Electronically signed: Dr. Eric Lindzen 04/01/2018, 10:39 AM        

## 2018-04-02 ENCOUNTER — Inpatient Hospital Stay (HOSPITAL_COMMUNITY): Payer: Self-pay

## 2018-04-02 DIAGNOSIS — Z515 Encounter for palliative care: Secondary | ICD-10-CM

## 2018-04-02 LAB — GLUCOSE, CAPILLARY
GLUCOSE-CAPILLARY: 88 mg/dL (ref 65–99)
GLUCOSE-CAPILLARY: 72 mg/dL (ref 65–99)
Glucose-Capillary: 55 mg/dL — ABNORMAL LOW (ref 65–99)
Glucose-Capillary: 58 mg/dL — ABNORMAL LOW (ref 65–99)
Glucose-Capillary: 73 mg/dL (ref 65–99)
Glucose-Capillary: 74 mg/dL (ref 65–99)
Glucose-Capillary: 74 mg/dL (ref 65–99)

## 2018-04-02 LAB — CBC WITH DIFFERENTIAL/PLATELET
BLASTS: 0 %
Band Neutrophils: 0 %
Basophils Absolute: 0 10*3/uL (ref 0.0–0.1)
Basophils Relative: 0 %
EOS ABS: 0.9 10*3/uL — AB (ref 0.0–0.7)
Eosinophils Relative: 9 %
HEMATOCRIT: 18.7 % — AB (ref 36.0–46.0)
HEMOGLOBIN: 6.2 g/dL — AB (ref 12.0–15.0)
LYMPHS PCT: 22 %
Lymphs Abs: 2.2 10*3/uL (ref 0.7–4.0)
MCH: 19.9 pg — ABNORMAL LOW (ref 26.0–34.0)
MCHC: 33.2 g/dL (ref 30.0–36.0)
MCV: 59.9 fL — AB (ref 78.0–100.0)
Metamyelocytes Relative: 0 %
Monocytes Absolute: 0.5 10*3/uL (ref 0.1–1.0)
Monocytes Relative: 5 %
Myelocytes: 0 %
NEUTROS ABS: 6.4 10*3/uL (ref 1.7–7.7)
NEUTROS PCT: 64 %
OTHER: 0 %
PROMYELOCYTES RELATIVE: 0 %
Platelets: 53 10*3/uL — ABNORMAL LOW (ref 150–400)
RBC: 3.12 MIL/uL — AB (ref 3.87–5.11)
RDW: 29.3 % — AB (ref 11.5–15.5)
WBC: 10 10*3/uL (ref 4.0–10.5)
nRBC: 3 /100 WBC — ABNORMAL HIGH

## 2018-04-02 LAB — PHOSPHORUS: PHOSPHORUS: 6.9 mg/dL — AB (ref 2.5–4.6)

## 2018-04-02 LAB — BASIC METABOLIC PANEL
Anion gap: 16 — ABNORMAL HIGH (ref 5–15)
BUN: 61 mg/dL — ABNORMAL HIGH (ref 6–20)
CALCIUM: 6.1 mg/dL — AB (ref 8.9–10.3)
CHLORIDE: 76 mmol/L — AB (ref 101–111)
CO2: 27 mmol/L (ref 22–32)
Creatinine, Ser: 4.84 mg/dL — ABNORMAL HIGH (ref 0.44–1.00)
GFR calc non Af Amer: 9 mL/min — ABNORMAL LOW (ref >60)
GFR, EST AFRICAN AMERICAN: 10 mL/min — AB (ref >60)
GLUCOSE: 55 mg/dL — AB (ref 65–99)
Potassium: 6 mmol/L — ABNORMAL HIGH (ref 3.5–5.1)
Sodium: 119 mmol/L — CL (ref 135–145)

## 2018-04-02 LAB — MAGNESIUM: Magnesium: 1.4 mg/dL — ABNORMAL LOW (ref 1.7–2.4)

## 2018-04-02 MED ORDER — DEXTROSE 10 % IV SOLN
INTRAVENOUS | Status: DC
  Administered 2018-04-02 – 2018-04-03 (×3): via INTRAVENOUS

## 2018-04-02 MED ORDER — DEXTROSE 50 % IV SOLN
25.0000 g | Freq: Once | INTRAVENOUS | Status: AC
  Administered 2018-04-02: 25 g via INTRAVENOUS
  Filled 2018-04-02: qty 50

## 2018-04-02 MED ORDER — DEXTROSE 50 % IV SOLN
INTRAVENOUS | Status: AC
  Administered 2018-04-02: 25 mL
  Filled 2018-04-02: qty 50

## 2018-04-02 NOTE — Progress Notes (Addendum)
PULMONARY / CRITICAL CARE MEDICINE   Name: Courtney Norton MRN: [id number] DOB: 06/18/1956    ADMISSION DATE:  Apr 05, 2018    REFERRING MD:  None     CHIEF COMPLAINT: Cardiac Arrest  HISTORY OF PRESENT ILLNESS:   62 year old found down in a motel room with cardiac arrest transported to [facility].  The patient apparently had been admitted to the hospital 1 month ago fgor anemia and atrial fibrillation.   Subjective: 62 year old female with extensive neurological injury from anoxia with no hope of survival.  Suspect she will be a withdrawal of care in the near future.Marland Kitchen     VITAL SIGNS: BP 121/65 (BP Location: Left Arm)   Pulse 74   Temp 98.6 F (37 C) (Core)   Resp (!) 0   Ht 5\' 8"  (1.727 m)   Wt 104 kg (229 lb 4.5 oz)   SpO2 100%   BMI 34.86 kg/m   HEMODYNAMICS: CVP:  [13 mmHg-18 mmHg] 17 mmHgPatient was in profound shock. When she arrived currently on 5 mics of Levophed  VENTILATOR SETTINGS: Vent Mode: PRVC FiO2 (%):  [40 %] 40 % Set Rate:  [24 bmp] 24 bmp Vt Set:  [490 mL] 490 mL PEEP:  [5 cmH20] 5 cmH20 Plateau Pressure:  [29 cmH20-31 cmH20] 30 cmH20  INTAKE / OUTPUT: I/O last 3 completed shifts: In: 7028.7 [I.V.:6328.7; IV Piggyback:700] Out: 790 [Urine:290; Emesis/NG output:500]  PHYSICAL EXAMINATION: General: Obese female nonresponsive HEENT: Endotracheal tube to ventilator PSY: Nonresponsive Neuro: Unresponsive, pupils fixed and dilated, negative doll's, no corneal reflex CV: s1s2 rrr, no m/r/g PULM: even/non-labored, lungs bilaterally diminished WU:[id number], non-tender, bsx4 active  Extremities: warm/dry, 1+ edema  Skin: no rashes or lesions   LABS:  BMET Recent Labs  Lab 04/01/18 0658 04/01/18 1134 04/02/18 0317  NA 122* 120* 119*  K 6.5* 6.4* 6.0*  CL 80* 77* 76*  CO2 26 26 27   BUN 58* 60* 61*  CREATININE 4.44* 4.57* 4.84*  GLUCOSE 80 181* 55*    Electrolytes Recent Labs  Lab 03/30/18 0400 03/30/18 0809  03/31/18 0452  04/01/18 0658 04/01/18 1134 04/02/18 0317  CALCIUM 6.6* 6.3* 6.0*   < > 6.1* 6.0* 6.1*  MG 1.6* 1.5* 1.8  --   --   --  1.4*  PHOS 6.0* 6.2*  --   --   --   --  6.9*   < > = values in this interval not displayed.    CBC Recent Labs  Lab 03/31/18 0452 04/01/18 0415 04/02/18 0317  WBC 20.6* 15.1* 10.0  HGB 6.3* 6.5* 6.2*  HCT 20.8* 20.0* 18.7*  PLT 101* 79* 53*    Coag's Recent Labs  Lab 04-05-2018 1808 03/29/18 0147 03/29/18 0747  APTT 41* 104*  --   INR 1.86 2.19 2.19    Sepsis Markers Recent Labs  Lab 03/29/18 1205  03/29/18 2339 03/30/18 0400 03/30/18 0810 03/31/18 0452  LATICACIDVEN  --    < > 4.5* 3.3* 2.7*  --   PROCALCITON 28.93  --   --  72.07  --  56.54   < > = values in this interval not displayed.    ABG Recent Labs  Lab 03/29/18 0227 03/29/18 0547 03/31/18 1053  PHART 7.165* 7.262* 7.369  PCO2ART 54.3* 45.4 47.5  PO2ART 110* 88.5 59.0*    Liver Enzymes Recent Labs  Lab 03/30/18 0400 03/31/18 0452 04/01/18 0658  AST 2,057* 986* 377*  ALT 2,127* 1,631* 1,018*  ALKPHOS 82 129* 126  BILITOT  0.6 0.8 1.2  ALBUMIN 1.9* 1.7* 1.6*    Cardiac Enzymes Recent Labs  Lab 03/31/2018 2213 03/29/18 0538 03/29/18 1204  TROPONINI 0.73* 1.10* 1.21*    Glucose Recent Labs  Lab 04/01/18 1946 04/01/18 2346 04/02/18 0324 04/02/18 0413 04/02/18 0829 04/02/18 0948  GLUCAP 67 73 55* 88 58* 72    Imaging Dg Chest Port 1 View  Result Date: 04/02/2018 CLINICAL DATA:  Follow-up endotracheal tube placement EXAM: PORTABLE CHEST 1 VIEW COMPARISON:  03/31/2018 FINDINGS: Cardiac shadow is stable. Endotracheal tube is noted in satisfactory position. Diffuse bilateral infiltrates are again identified with left worse than right likely related underlying pulmonary edema. Right pleural effusion is noted. Multiple right-sided rib fractures are noted without pneumothorax. No other focal abnormality is seen. IMPRESSION: Overall stable appearance  of the chest.  No pneumothorax is noted. Electronically Signed   By: Alcide CleverMark  Lukens M.D.   On: 04/02/2018 08:31       LINES/TUBES:  oral ET Arterial line.  CVP OGT   ASSESSMENT / PLAN:  PULMONARY Acute hypoxemic respiratory failure Ventilator dependent respiratory failure secondary to V. fib cardiac arrest Aspiration PNA   Plan: Vent bundle Suspected to be withdrawal of care for future  CARDIOVASCULAR V. fib cardiac arrest Shock secondary to cardiac shock versus septic shock. History of atrial fibrillation Thrombocytopenia Plan  Wean pressors off with no increase in pressure support. Most likely will be a withdrawal here in the near future      RENAL Acute renal failure secondary to prerenal versus renal. Hyperkalemia  Anion gap metabolic acidosis secondary to lactic acidosis Lab Results  Component Value Date   CREATININE 4.84 (H) 04/02/2018   CREATININE 4.57 (H) 04/01/2018   CREATININE 4.44 (H) 04/01/2018   Recent Labs  Lab 04/01/18 0658 04/01/18 1134 04/02/18 0317  K 6.5* 6.4* 6.0*   Lactic Acid, Venous    Component Value Date/Time   LATICACIDVEN 2.7 (HH) 03/30/2018 0810    Plan: No nephrology consult at this time. Kayexalate.   sodium bicarb 75 cc/h.   Scant urine output  GI Elevated LFTs secondary to ischemic hepatic injury Shocked liver  Plan: 04/01/2018 repeat LFT ordered.  Repeat  LFTs with slight improvement Protonix prophylaxis   HEMATOLOGIC Microcytic anemia Recent Labs    04/01/18 0415 04/02/18 0317  HGB 6.5* 6.2*     Plan: 04/02/2018 she is a DNR no further blood products will be utilized.   Endocrine. CBG (last 3)  Recent Labs    04/02/18 0413 04/02/18 0829 04/02/18 0948  GLUCAP 88 58* 72    Previous hypercalcemia that is well controlled No she is been hypoglycemic therefore Levemir has been stopped and placed on dextrose 10% Plan:  Sliding scale insulin every 4 hours DC Levemir D10 50 cc an  hour  NEUROLOGIC Acute encephalopathy secondary to cardiac arrest versus seizure versus CVA van not rule out anoxic brain injury Plan: MRI was severe brain injury several better variation in 14 2019 She was made a DNR on 04/01/2018 Further discussion with family concerning withdrawal of care.   Infectious disease Rule out septic shock Plan: Currently on Azactam started 03/29/2018>> Clindamycin started 04/11/2018>> Vancomycin started 03/31/2018>> No positive culture data does not appear to have blood cultures were ordered on 03/29/2018 Blood cultures ordered 04/01/2018 along with respiratory culture 04/02/2018 no return culture data as of yet   Brett CanalesSteve Minor ACNP [facility] Pager [phone] till 1 pm If no answer page [phone] 04/02/2018, 10:29 AM'  Attending  Note:  63 year old female s/p cardiac arrest with severe anoxic brain injury.  On exam, she has no gag, corneal, pupillary or dolls eye reflexes but does have a respiratory drive that is very weak.  Patient has been off sedation and paralytics since 4/9 with no response.  I reviewed CXR myself, ETT is in good position.  Family is not coming in today because they are "tired."  Neurology would like to examine patient off sedation and paralytic before prognostication.  Per family discussion yesterday, patient is a full DNR.  Once pressors are off then will not restart given DNR status.  Awaiting family input on time of withdrawal after neuro evaluation.  The patient is critically ill with multiple organ systems failure and requires high complexity decision making for assessment and support, frequent evaluation and titration of therapies, application of advanced monitoring technologies and extensive interpretation of multiple databases.   Critical Care Time devoted to patient care services described in this note is  35  Minutes. This time reflects time of care of this signee Dr Koren Bound. This critical care time does not reflect  procedure time, or teaching time or supervisory time of PA/NP/Med student/Med Resident etc but could involve care discussion time.  Alyson Reedy, M.D. [facility] Pulmonary/Critical Care Medicine. Pager: [phone]. After hours pager: [phone].

## 2018-04-02 NOTE — Progress Notes (Signed)
Nutrition Follow Up/Brief Note  Chart reviewed. Spoke with Dr. Molli KnockYacoub. Plan is for withdrawal of care in the near future.  No nutrition interventions warranted at this time.  Please consult as needed.   Maureen ChattersKatie Chozen [place], RD, LDN Pager #: [phone] After-Hours Pager #: [phone]

## 2018-04-03 ENCOUNTER — Inpatient Hospital Stay (HOSPITAL_COMMUNITY): Payer: Self-pay

## 2018-04-03 LAB — BASIC METABOLIC PANEL
Anion gap: 15 (ref 5–15)
BUN: 65 mg/dL — ABNORMAL HIGH (ref 6–20)
CO2: 28 mmol/L (ref 22–32)
Calcium: 6.7 mg/dL — ABNORMAL LOW (ref 8.9–10.3)
Chloride: 74 mmol/L — ABNORMAL LOW (ref 101–111)
Creatinine, Ser: 5.01 mg/dL — ABNORMAL HIGH (ref 0.44–1.00)
GFR calc Af Amer: 10 mL/min — ABNORMAL LOW (ref >60)
GFR, EST NON AFRICAN AMERICAN: 8 mL/min — AB (ref >60)
GLUCOSE: 93 mg/dL (ref 65–99)
POTASSIUM: 4 mmol/L (ref 3.5–5.1)
Sodium: 117 mmol/L — CL (ref 135–145)

## 2018-04-03 LAB — GLUCOSE, CAPILLARY
GLUCOSE-CAPILLARY: 100 mg/dL — AB (ref 65–99)
GLUCOSE-CAPILLARY: 85 mg/dL (ref 65–99)
GLUCOSE-CAPILLARY: 91 mg/dL (ref 65–99)
Glucose-Capillary: 100 mg/dL — ABNORMAL HIGH (ref 65–99)
Glucose-Capillary: 101 mg/dL — ABNORMAL HIGH (ref 65–99)
Glucose-Capillary: 101 mg/dL — ABNORMAL HIGH (ref 65–99)

## 2018-04-03 LAB — MAGNESIUM: Magnesium: 1.4 mg/dL — ABNORMAL LOW (ref 1.7–2.4)

## 2018-04-03 LAB — HEPATIC FUNCTION PANEL
ALBUMIN: 1.2 g/dL — AB (ref 3.5–5.0)
ALK PHOS: 154 U/L — AB (ref 38–126)
ALT: 424 U/L — AB (ref 14–54)
AST: 64 U/L — ABNORMAL HIGH (ref 15–41)
BILIRUBIN TOTAL: 1 mg/dL (ref 0.3–1.2)
Bilirubin, Direct: 0.5 mg/dL (ref 0.1–0.5)
Indirect Bilirubin: 0.5 mg/dL (ref 0.3–0.9)
Total Protein: 4 g/dL — ABNORMAL LOW (ref 6.5–8.1)

## 2018-04-03 LAB — CULTURE, RESPIRATORY W GRAM STAIN: Special Requests: NORMAL

## 2018-04-03 LAB — CULTURE, RESPIRATORY

## 2018-04-03 LAB — AMMONIA: AMMONIA: 19 umol/L (ref 9–35)

## 2018-04-03 LAB — CBC
HEMATOCRIT: 17.3 % — AB (ref 36.0–46.0)
HEMOGLOBIN: 5.7 g/dL — AB (ref 12.0–15.0)
MCH: 19.7 pg — AB (ref 26.0–34.0)
MCHC: 32.9 g/dL (ref 30.0–36.0)
MCV: 59.7 fL — ABNORMAL LOW (ref 78.0–100.0)
Platelets: 45 10*3/uL — ABNORMAL LOW (ref 150–400)
RBC: 2.9 MIL/uL — AB (ref 3.87–5.11)
RDW: 28.7 % — ABNORMAL HIGH (ref 11.5–15.5)
WBC: 9 10*3/uL (ref 4.0–10.5)

## 2018-04-03 LAB — PHOSPHORUS: Phosphorus: 6 mg/dL — ABNORMAL HIGH (ref 2.5–4.6)

## 2018-04-04 LAB — BPAM RBC
BLOOD PRODUCT EXPIRATION DATE: 201904152359
Blood Product Expiration Date: 201905032359
ISSUE DATE / TIME: 201904100945
UNIT TYPE AND RH: 6200
Unit Type and Rh: 600

## 2018-04-04 LAB — TYPE AND SCREEN
ABO/RH(D): A POS
ANTIBODY SCREEN: NEGATIVE
Unit division: 0
Unit division: 0

## 2018-04-06 ENCOUNTER — Telehealth: Payer: Self-pay

## 2018-04-06 LAB — CULTURE, BLOOD (ROUTINE X 2)
CULTURE: NO GROWTH
Culture: NO GROWTH
Special Requests: ADEQUATE
Special Requests: ADEQUATE

## 2018-04-06 NOTE — Telephone Encounter (Signed)
On 04/06/18 I received a d/c from [employer] (original). The d/c is for cremation. The patient is a patient of Doctor Ramaswamy. The d/c will be taken to Pulmonary Unit for signature.  On 04/08/18 I received the d/c back from Doctor McQuaid who signed the d/c for Ramaswamy. I got the d/c ready and called the funeral home to let them know the d/c is ready for pickup. I also faxed the d/c to the funeral home per the funeral home request.

## 2018-04-21 NOTE — Progress Notes (Signed)
   10-24-18 1500  Clinical Encounter Type  Visited With Patient and family together  Visit Type Critical Care  Referral From Nurse  Consult/Referral To Chaplain  Spiritual Encounters  Spiritual Needs Prayer;Emotional;Grief support  Stress Factors  Family Stress Factors Major life changes;Loss  Chaplain was called to PT room after imminent diagnosis.  The family requested prayer / last rites of sorts as they are grieving.  Chaplain prayed with the family and offered words of encouragement.

## 2018-04-21 NOTE — Progress Notes (Signed)
Called [facility], spoke with Merci to report critical lab of  Na-117 and hgb-5.7 to MD on call.

## 2018-04-21 NOTE — Progress Notes (Signed)
Long discussion with husband at bedside.  Updated on current condition, poor prognosis, and options for care.  Given what appears to be a devastating neurological injury family would like to proceed with withdrawal of care. Plan Extubate, continue comfort measures  Simonne MartinetPeter E Tay Whitwell ACNP-BC [facility] Pulmonary/Critical [facility] Pager # [phone] OR # [phone] if no answer

## 2018-04-21 NOTE — Progress Notes (Signed)
PULMONARY / CRITICAL CARE MEDICINE   Name: Courtney GuthrieDeborah Norton MRN: [id number] DOB: 10/01/1956    ADMISSION DATE:  04/16/2018    REFERRING MD:  None     CHIEF COMPLAINT: Cardiac Arrest  HISTORY OF PRESENT ILLNESS:   62 year old found down in a motel room with cardiac arrest [facility].  The patient apparently had been admitted to the hospital 1 month ago fgor anemia and atrial fibrillation.   Subjective: Remains unresponsive     VITAL SIGNS: Blood Pressure 103/65   Pulse 61   Temperature (Abnormal) 97.5 F (36.4 C) (Axillary)   Respiration (Abnormal) 24   Height 5\' 8"  (1.727 m)   Weight 229 lb 4.5 oz (104 kg)   Oxygen Saturation 100%   Body Mass Index 34.86 kg/m   HEMODYNAMICS: CVP:  [13 mmHg-26 mmHg] 18 mmHgPatient was in profound shock. When she arrived currently on 5 mics of Levophed  VENTILATOR SETTINGS: Vent Mode: PRVC FiO2 (%):  [40 %] 40 % Set Rate:  [24 bmp] 24 bmp Vt Set:  [490 mL] 490 mL PEEP:  [5 cmH20] 5 cmH20 Plateau Pressure:  [30 cmH20-31 cmH20] 30 cmH20  INTAKE / OUTPUT: I/O last 3 completed shifts: In: 6629.6 [I.V.:6079.6; IV Piggyback:550] Out: 1255 [Urine:305; Emesis/NG output:950]  PHYSICAL EXAMINATION: General: 62 year old female unresponsive on ventilator HEENT: Normocephalic atraumatic orally intubated Pulmonary: Scattered rhonchi Cardiac: Regular rate and rhythm Abdomen: Soft nontender, Extremities: Warm, dry, brisk cap refill, no significant edema. Neuro: GCS 3  LABS:  BMET Recent Labs  Lab 04/01/18 1134 04/02/18 0317 04/10/2018 0408  NA 120* 119* 117*  K 6.4* 6.0* 4.0  CL 77* 76* 74*  CO2 26 27 28   BUN 60* 61* 65*  CREATININE 4.57* 4.84* 5.01*  GLUCOSE 181* 55* 93    Electrolytes Recent Labs  Lab 03/30/18 0809 03/31/18 0452  04/01/18 1134 04/02/18 0317 04/09/2018 0408  CALCIUM 6.3* 6.0*   < > 6.0* 6.1* 6.7*  MG 1.5* 1.8  --   --  1.4* 1.4*  PHOS 6.2*  --   --   --  6.9* 6.0*   < > =  values in this interval not displayed.    CBC Recent Labs  Lab 04/01/18 0415 04/02/18 0317 04/18/2018 0408  WBC 15.1* 10.0 9.0  HGB 6.5* 6.2* 5.7*  HCT 20.0* 18.7* 17.3*  PLT 79* 53* 45*    Coag's Recent Labs  Lab 10-22-2018 1808 03/29/18 0147 03/29/18 0747  APTT 41* 104*  --   INR 1.86 2.19 2.19    Sepsis Markers Recent Labs  Lab 03/29/18 1205  03/29/18 2339 03/30/18 0400 03/30/18 0810 03/31/18 0452  LATICACIDVEN  --    < > 4.5* 3.3* 2.7*  --   PROCALCITON 28.93  --   --  72.07  --  56.54   < > = values in this interval not displayed.    ABG Recent Labs  Lab 03/29/18 0227 03/29/18 0547 03/31/18 1053  PHART 7.165* 7.262* 7.369  PCO2ART 54.3* 45.4 47.5  PO2ART 110* 88.5 59.0*    Liver Enzymes Recent Labs  Lab 03/31/18 0452 04/01/18 0658 04/01/2018 0408  AST 986* 377* 64*  ALT 1,631* 1,018* 424*  ALKPHOS 129* 126 154*  BILITOT 0.8 1.2 1.0  ALBUMIN 1.7* 1.6* 1.2*    Cardiac Enzymes Recent Labs  Lab 10-22-2018 2213 03/29/18 0538 03/29/18 1204  TROPONINI 0.73* 1.10* 1.21*    Glucose Recent Labs  Lab 04/02/18 1620 04/02/18 1956 03/23/2018 0013 03/26/2018 0318 04/10/2018 0417  04/05/2018 0749  GLUCAP 74 100* 100* 101* 101* 85    Imaging Dg Chest Port 1 View  Result Date: 04/14/2018 CLINICAL DATA:  Endotracheal tube EXAM: PORTABLE CHEST 1 VIEW COMPARISON:  Chest x-rays dated 04/02/2018 and 03/31/2018. FINDINGS: Endotracheal tube and enteric tube are appropriately positioned. Cardiomegaly appears stable. Bilateral perihilar opacities, LEFT greater than RIGHT, are not significantly changed. Probable small RIGHT pleural effusion. No pneumothorax seen. Multiple RIGHT-sided rib fractures again noted. IMPRESSION: 1. Persistent bilateral perihilar airspace opacities, LEFT greater than RIGHT, not significantly changed, again most suggestive of pulmonary edema. 2. Stable small RIGHT pleural effusion. 3. Stable cardiomegaly. 4. Endotracheal tube and enteric tube  remain appropriately positioned. Electronically Signed   By: Bary Richard M.D.   On: 04/02/2018 08:01       LINES/TUBES:  oral ET Arterial line.  CVP OGT   ASSESSMENT / PLAN: Acute hypoxemic respiratory failure Ventilator dependent respiratory failure secondary to V. fib cardiac arrest Aspiration PNA  V. fib cardiac arrest Shock secondary to cardiac shock versus septic shock. History of atrial fibrillation Thrombocytopenia Acute renal failure secondary to prerenal versus renal. Anion gap metabolic acidosis secondary to lactic acidosis Multiple electrolyte derangements: hyponatremia, hypomagnesemia   Elevated LFTs secondary to ischemic hepatic injury Shocked liver  Microcytic anemia Hypoglycemia  Acute encephalopathy secondary to cardiac arrest versus seizure versus CVA Rule out septic shock   Discussion 62 year old female status post prolonged cardiac arrest with devastating neurological injury.  Clinically she has progressive organ failure, she remains in a coma.  She is still pressor dependent and now renal function is worse.  This is not a survivable injury, we are awaiting family to arrive, anticipate withdrawal of care soon.  Plan Discontinue further blood draws Continue full ventilator support, no further titration of drips, discontinue antibiotics supportive care   Simonne Martinet ACNP-BC [facility] Pulmonary/Critical Care Pager # [phone] OR # [phone] if no answer

## 2018-04-21 NOTE — Procedures (Signed)
Extubation Procedure Note  Patient Details:   Name: Durenda GuthrieDeborah Caskey-Slamon DOB: 04/11/1956 MRN: [id number]   Airway Documentation:     Evaluation  O2 sats: currently acceptable Complications: No apparent complications Patient did tolerate procedure well. Bilateral Breath Sounds: Diminished, Rhonchi   No   PT extubated per MD order for terminal.  Pt is a DNR.  RN at bedside.    Ronny FlurryMorgan B Tristine Langi 09-12-18, 6:07 PM

## 2018-04-21 NOTE — Progress Notes (Signed)
No new orders received regarding critical lab.

## 2018-04-21 DEATH — deceased

## 2018-05-22 NOTE — Discharge Summary (Signed)
DISCHARGE SUMMARY    Date of admit: Apr 08, 2018  1:44 PM Date of discharge: 04/17/2018  7:05 PM Length of Stay: 6 days  PCP is Patient, No Pcp Per   PROBLEM LIST Active Problems:   Cardiac arrest (HCC)   Acute respiratory failure with hypoxia (HCC)   Acute renal failure (HCC)   Shock liver   Acute metabolic encephalopathy  Acute hypoxemic respiratory failure Ventilator dependent respiratory failure secondary to V. fib cardiac arrest Aspiration PNA  V. fib cardiac arrest Shock secondary to cardiac shock versus septic shock. History of atrial fibrillation Thrombocytopenia Acute renal failure secondary to prerenal versus renal. Anion gap metabolic acidosis secondary to lactic acidosis Multiple electrolyte derangements: hyponatremia, hypomagnesemia   Elevated LFTs secondary to ischemic hepatic injury Shocked liver  Microcytic anemia Hypoglycemia  Acute encephalopathy secondary to cardiac arrest versus seizure versus CVA Rule out septic shock     SUMMARY Courtney Norton was 62 y.o. patient with    has no past medical history on file.   has no past surgical history on file.   Admitted on 04/08/18 with  62 year old found down in a motel room with cardiac arrest transported to [facility]. The patient apparently had been admitted to the hospital 1 month ago fgor anemia and atrial fibrillation.  MRI showed severe anoxic brain injury. She remained comatose and after family discussions palliative wean performed and she expired 03/29/2018       SIGNED Dr. Kalman Shan, M.D., F.C.C.P Pulmonary and Critical Care Medicine Staff Physician [facility] Pulmonary and Critical Care Pager: [phone], If no answer or between  15:00h - 7:00h: call [phone]  04/22/2018 8:47 AM

## 2020-01-06 IMAGING — CT CT HEAD W/O CM
3 series · 16 of 47 positions shown, 19 images · non-contrast
Comparison: None.

CLINICAL DATA: 57-year-old female found unconscious in a motel

EXAM:
CT HEAD WITHOUT CONTRAST
TECHNIQUE: Contiguous axial images were obtained from the base of the skull
through the vertex without intravenous contrast.

[Series 2: head trauma wo · axial · 0.47mm/px · z∈[-109,+36]mm · 10 of 35 slices shown, 13 images]
[im 3/35  brain]
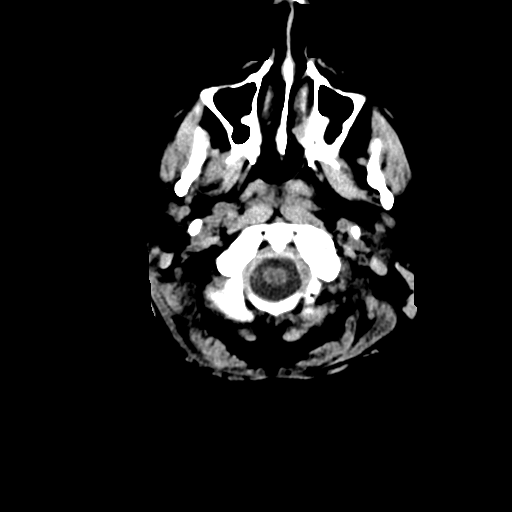
[im 3/35  bone]
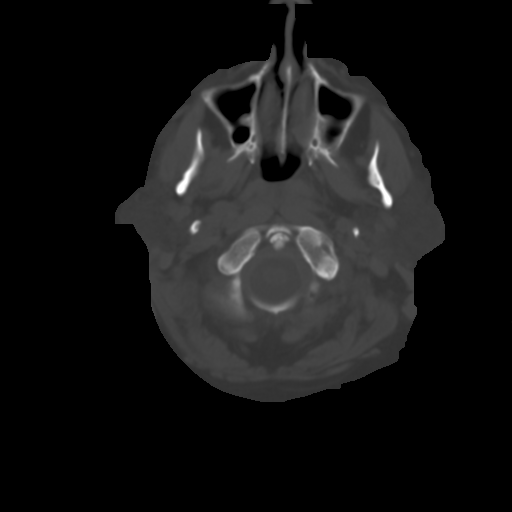
[im 6/35  brain]
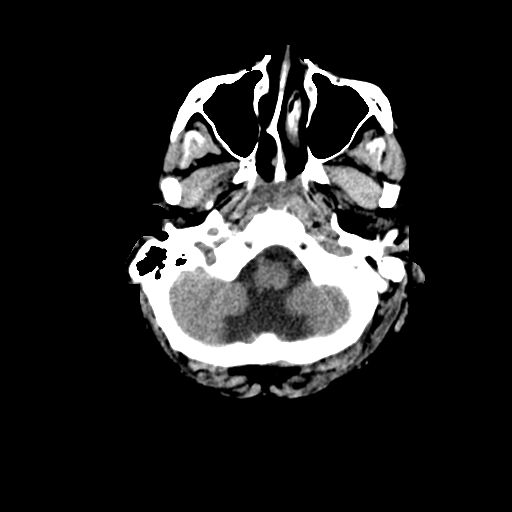
[im 10/35  brain]
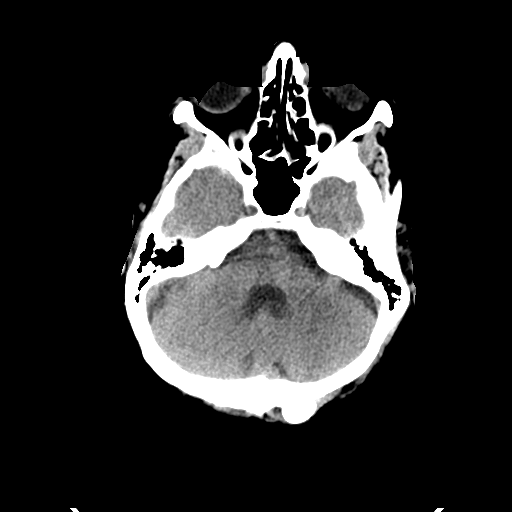
[im 12/35  brain]
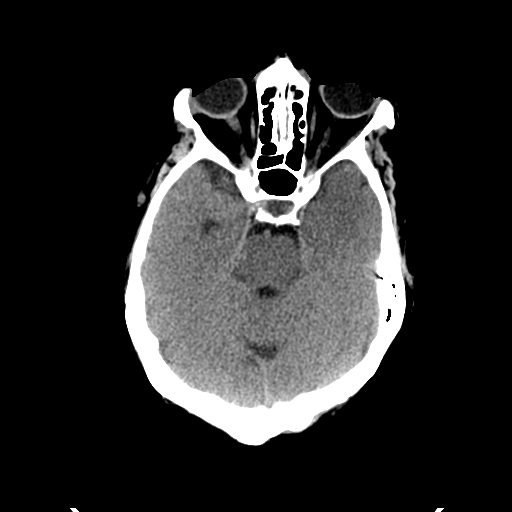
[im 16/35  brain]
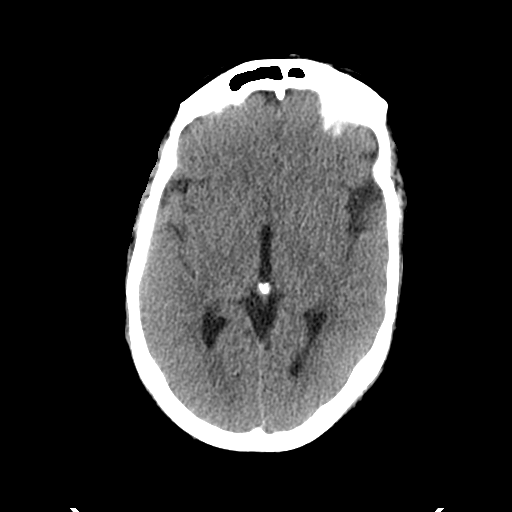
[im 16/35  bone]
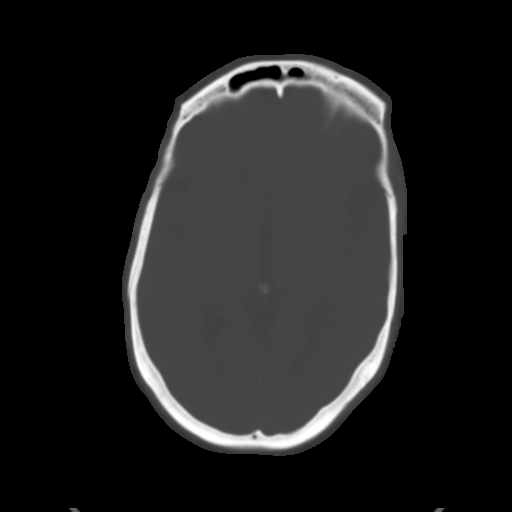
[im 19/35  brain]
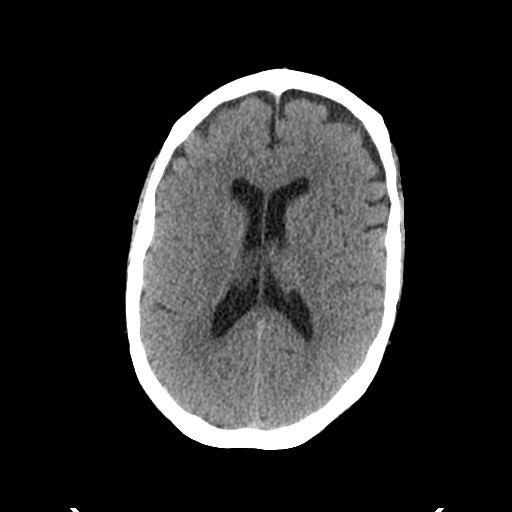
[im 23/35  brain]
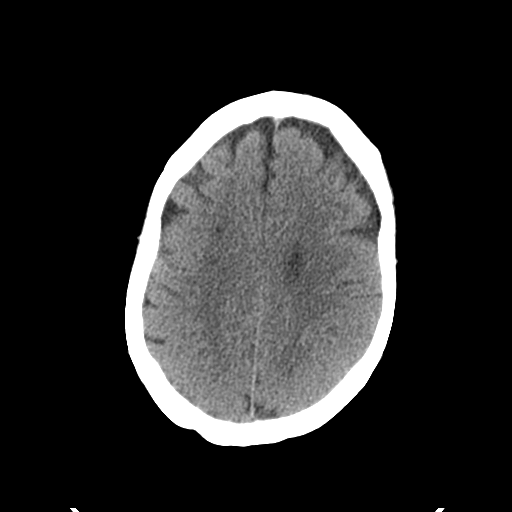
[im 26/35  brain]
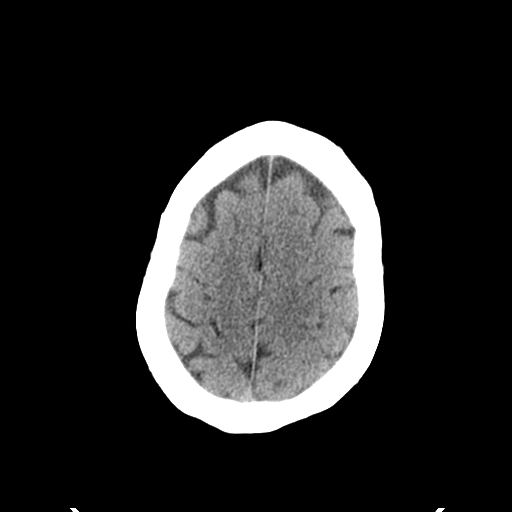
[im 29/35  brain]
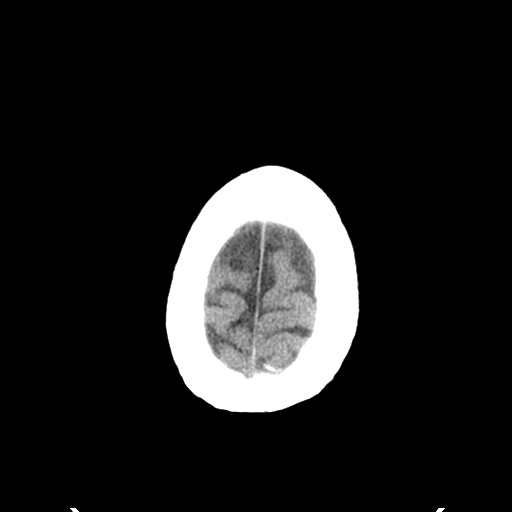
[im 29/35  bone]
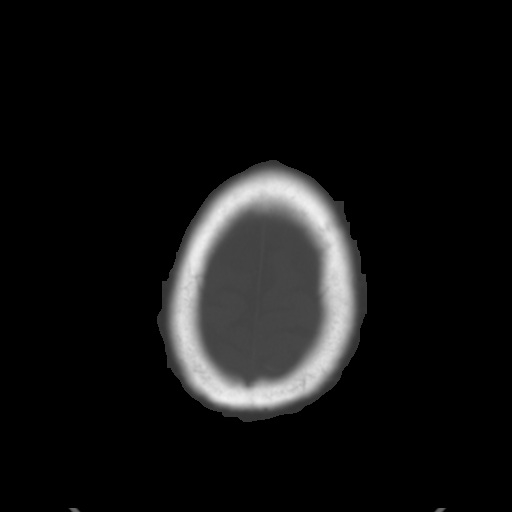
[im 32/35  brain]
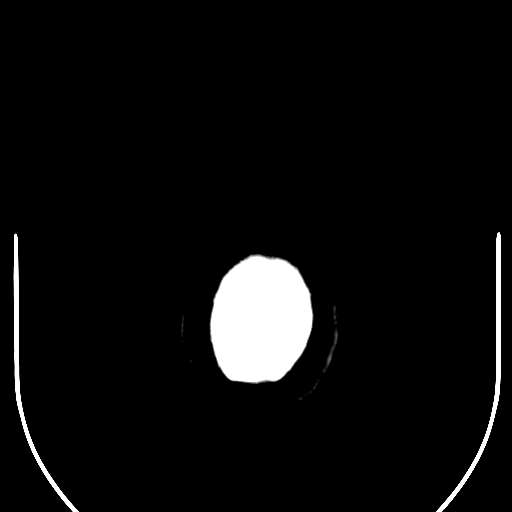

[Series 4: coronal soft tissue · coronal · 0.35mm/px · 3 of 74 slices shown]
[im 25/74  brain]
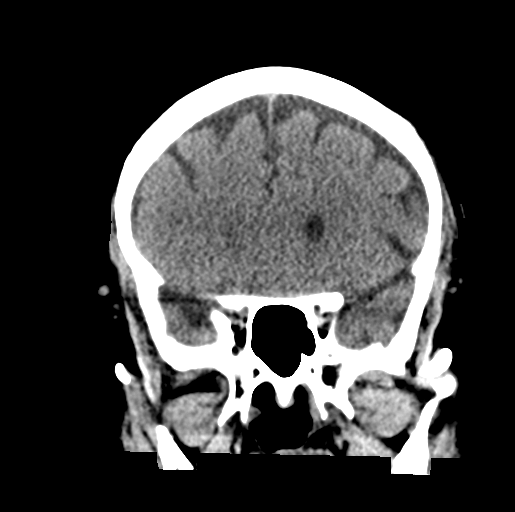
[im 33/74  brain]
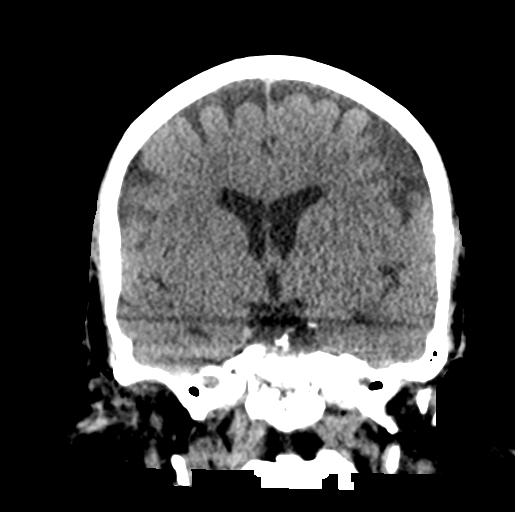
[im 41/74  brain]
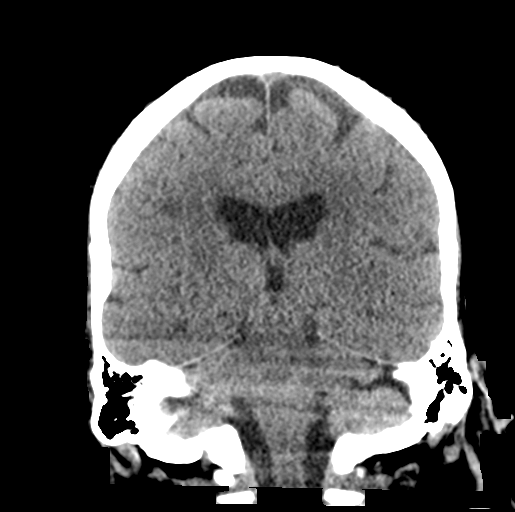

[Series 5: sagittal soft tissue · sagittal · 0.36mm/px · 3 of 63 slices shown]
[im 21/63  brain]
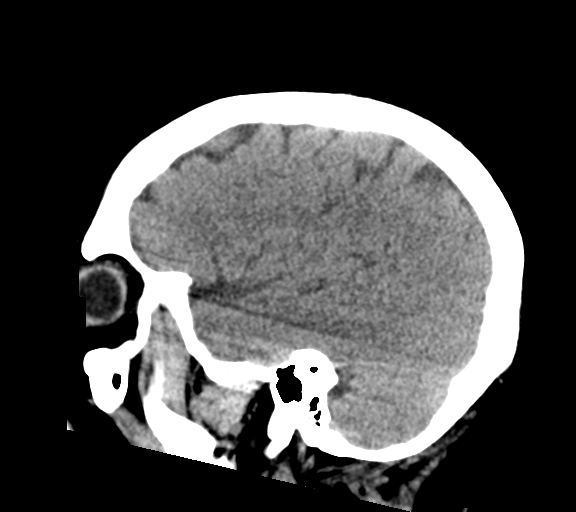
[im 32/63  brain]
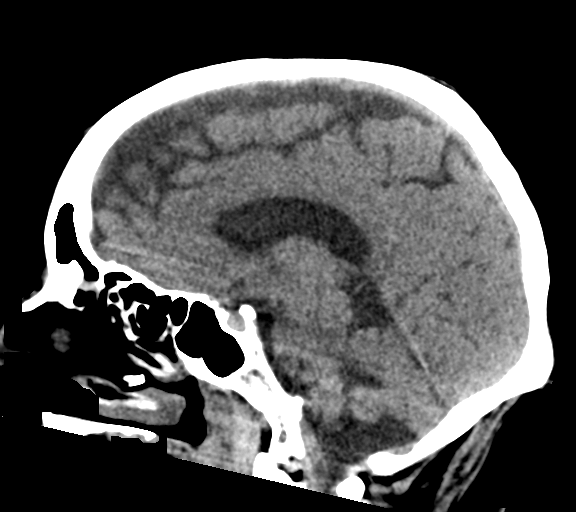
[im 42/63  brain]
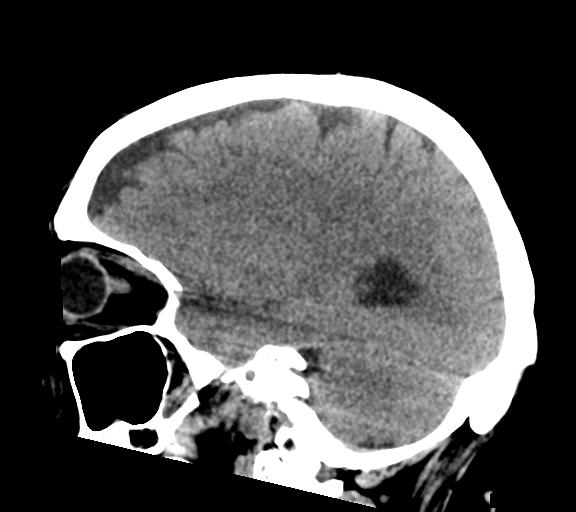

[16 of 47 positions shown; findings below may reference images not displayed]

FINDINGS: Brain: No evidence of acute infarction, hemorrhage, hydrocephalus,
extra-axial collection or mass lesion/mass effect. Mild cerebral
cortical atrophy, slightly advanced for age.

Vascular: No hyperdense vessel or unexpected calcification.

Skull: Normal. Negative for fracture or focal lesion.

Sinuses/Orbits: No acute finding.

Other: None.
IMPRESSION: No acute intracranial abnormality.

Mild cerebral cortical atrophy greater than expected for age.

## 2020-01-12 IMAGING — DX DG CHEST 1V PORT
1 series · 1 of 1 positions shown · non-contrast
Comparison: Chest x-rays dated 04/02/2018 and 03/31/2018.

CLINICAL DATA: Endotracheal tube

EXAM:
PORTABLE CHEST 1 VIEW

[chest ap]
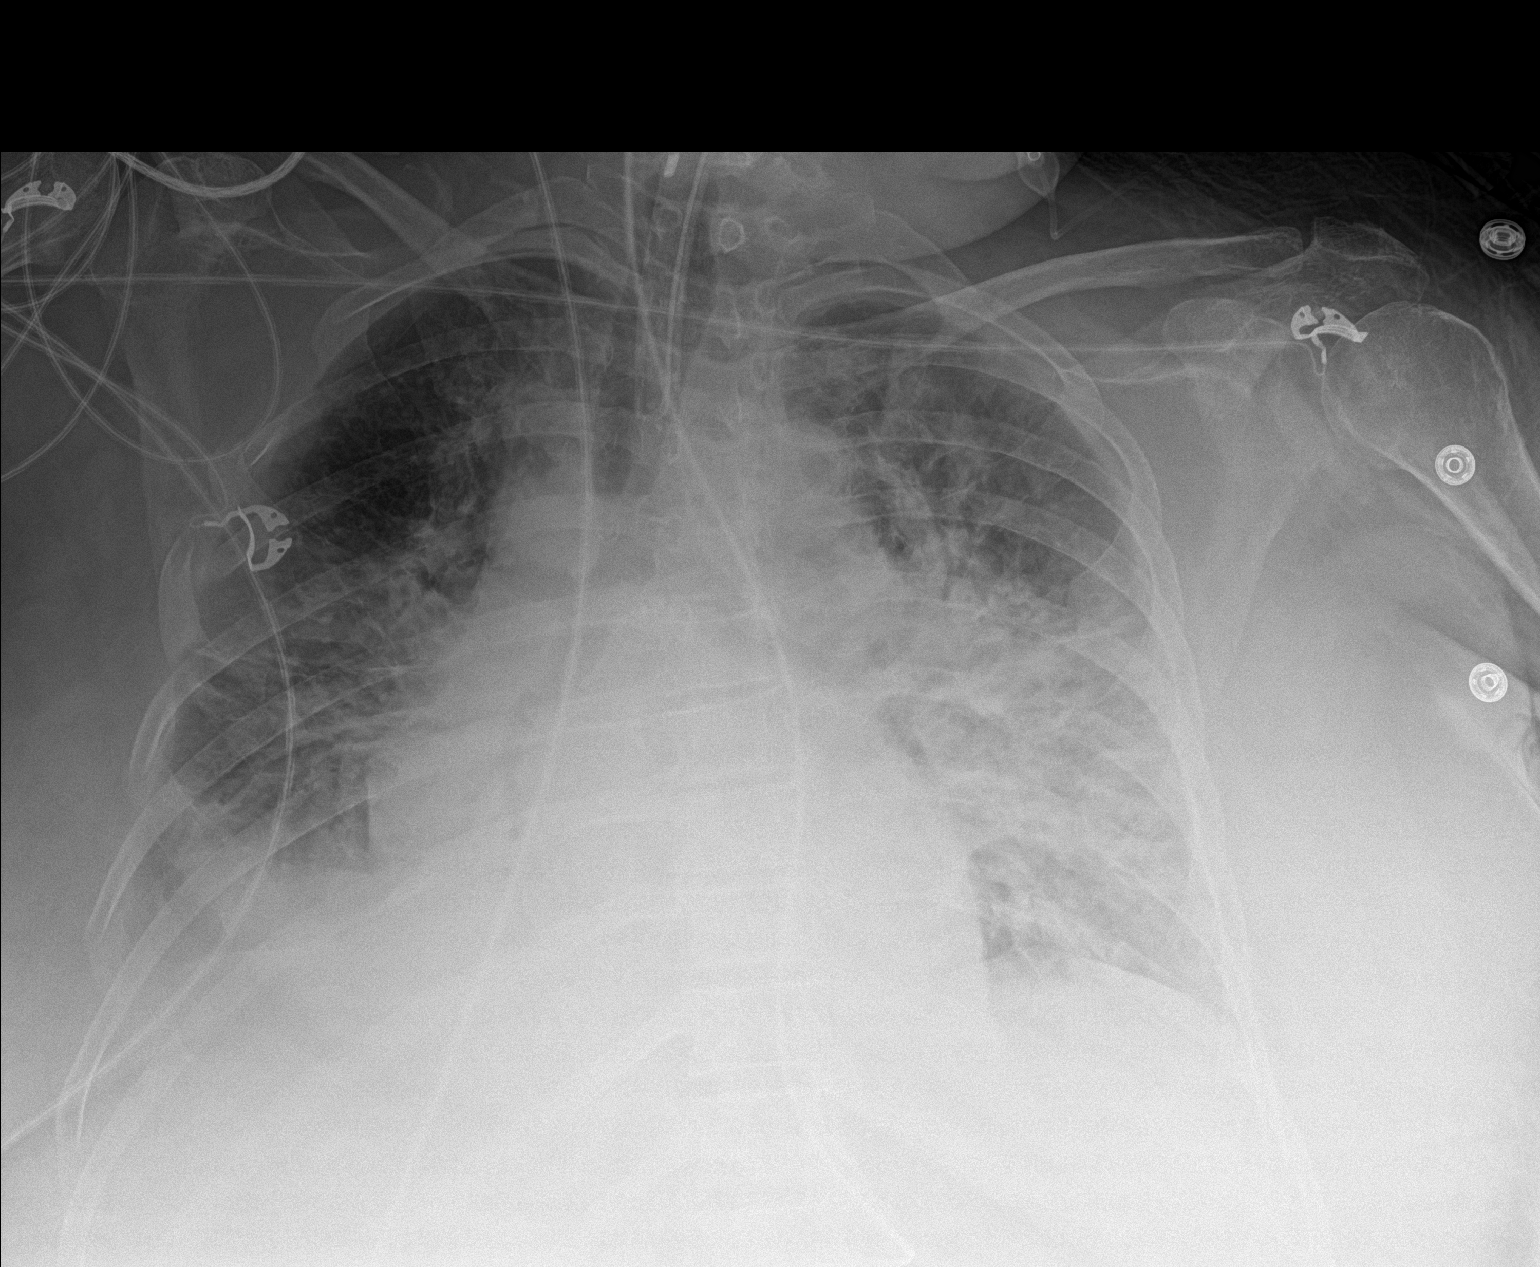

[1 of 1 positions shown; findings below may reference images not displayed]

FINDINGS: Endotracheal tube and enteric tube are appropriately positioned.
Cardiomegaly appears stable. Bilateral perihilar opacities, LEFT
greater than RIGHT, are not significantly changed. Probable small
RIGHT pleural effusion. No pneumothorax seen. Multiple RIGHT-sided
rib fractures again noted.
IMPRESSION: 1. Persistent bilateral perihilar airspace opacities, LEFT greater
than RIGHT, not significantly changed, again most suggestive of
pulmonary edema.
2. Stable small RIGHT pleural effusion.
3. Stable cardiomegaly.
4. Endotracheal tube and enteric tube remain appropriately
positioned.
